# Patient Record
Sex: Male | Born: 1980
Health system: Southern US, Community
[De-identification: ages and names within clinical notes are randomized; demographics above are authoritative.]

## PROBLEM LIST (undated history)

## (undated) DIAGNOSIS — J45909 Unspecified asthma, uncomplicated: Secondary | ICD-10-CM

## (undated) DIAGNOSIS — N481 Balanitis: Secondary | ICD-10-CM

## (undated) DIAGNOSIS — N200 Calculus of kidney: Secondary | ICD-10-CM

## (undated) DIAGNOSIS — Z0000 Encounter for general adult medical examination without abnormal findings: Secondary | ICD-10-CM

## (undated) DIAGNOSIS — Z114 Encounter for screening for human immunodeficiency virus [HIV]: Secondary | ICD-10-CM

## (undated) DIAGNOSIS — J029 Acute pharyngitis, unspecified: Secondary | ICD-10-CM

## (undated) HISTORY — DX: Calculus of kidney: N20.0

## (undated) HISTORY — DX: Unspecified asthma, uncomplicated: J45.909

## (undated) HISTORY — DX: Encounter for general adult medical examination without abnormal findings: Z00.00

## (undated) HISTORY — DX: Balanitis: N48.1

## (undated) HISTORY — DX: Acute pharyngitis, unspecified: J02.9

## (undated) HISTORY — PX: OTHER SURGICAL HISTORY: SHX169

## (undated) HISTORY — DX: Encounter for screening for human immunodeficiency virus (HIV): Z11.4

---

## 2008-11-07 ENCOUNTER — Emergency Department: Payer: Self-pay | Admitting: Emergency Medicine

## 2012-12-26 ENCOUNTER — Emergency Department: Payer: Self-pay | Admitting: Emergency Medicine

## 2015-06-03 ENCOUNTER — Ambulatory Visit (INDEPENDENT_AMBULATORY_CARE_PROVIDER_SITE_OTHER): Payer: PRIVATE HEALTH INSURANCE | Admitting: Family Medicine

## 2015-06-03 ENCOUNTER — Encounter: Payer: Self-pay | Admitting: Family Medicine

## 2015-06-03 VITALS — BP 125/81 | HR 85 | Temp 97.8°F | Ht 66.5 in | Wt 246.0 lb

## 2015-06-03 DIAGNOSIS — E669 Obesity, unspecified: Secondary | ICD-10-CM

## 2015-06-03 DIAGNOSIS — Z23 Encounter for immunization: Secondary | ICD-10-CM

## 2015-06-03 DIAGNOSIS — J029 Acute pharyngitis, unspecified: Secondary | ICD-10-CM | POA: Insufficient documentation

## 2015-06-03 DIAGNOSIS — Z720 Tobacco use: Secondary | ICD-10-CM

## 2015-06-03 HISTORY — DX: Acute pharyngitis, unspecified: J02.9

## 2015-06-03 MED ORDER — TETANUS-DIPHTH-ACELL PERTUSSIS 5-2.5-18.5 LF-MCG/0.5 IM SUSP
0.5000 mL | Freq: Once | INTRAMUSCULAR | Status: AC
  Administered 2015-06-03: 0.5 mL via INTRAMUSCULAR

## 2015-06-03 MED ORDER — AMOXICILLIN-POT CLAVULANATE 875-125 MG PO TABS: 1.0000 | ORAL_TABLET | Freq: Two times a day (BID) | ORAL | Status: DC

## 2015-06-03 NOTE — Patient Instructions (Addendum)
Try vitamin C (orange juice if not diabetic or vitamin C tablets) and drink green tea to help your immune system during your illness Get plenty of rest and hydration Start the antibiotics Please do eat yogurt daily or take a probiotic daily for the next month or two We want to replace the healthy germs in the gut If you notice foul, watery diarrhea in the next two months, schedule an appointment RIGHT AWAY Return for a physical Check out the information at [email or web address] entitled "What It Takes to Lose Weight" Try to lose between 1-2 pounds per week by taking in fewer calories and burning off more calories You can succeed by limiting portions, limiting foods dense in calories and fat, becoming more active, and drinking 8 glasses of water a day (64 ounces) Don't skip meals, especially breakfast, as skipping meals may alter your metabolism Do not use over-the-counter weight loss pills or gimmicks that claim rapid weight loss A healthy BMI (or body mass index) is between 18.5 and 24.9 You can calculate your ideal BMI at the NIH website [employer]huhttp://www.nhlbi.nih.gov/health/educational/lose_wt/BMI/bmicalc.htm You received the vaccine to protect against tetanus and diphtheria and pertussis today; the tetanus and diphtheria portions will provide protection up to ten years, and the pertussis component will give you protection against whooping cough for life    Smokeless Tobacco Use Smokeless tobacco is a loose, fine, or stringy tobacco. The tobacco is not smoked like a cigarette, but it is chewed or held in the lips or cheeks. It resembles tea and comes from the leaves of the tobacco plant. Smokeless tobacco is usually flavored, sweetened, or processed in some way. Although smokeless tobacco is not smoked into the lungs, its chemicals are absorbed through the membranes in the mouth and into the bloodstream. Its chemicals are also swallowed in saliva. The chemicals (nicotine and other toxins) are known to  cause cancer. Smokeless tobacco contains up to 28 differentcarcinogens. CAUSES Nicotine is addictive. Smokeless tobacco contains nicotine, which is a stimulant. This stimulant can give you a "buzz" or altered state. People can become addicted to the feeling it delivers.  SYMPTOMS Smokeless tobacco can cause health problems, including:  Bad breath.  Yellow-brown teeth.  Mouth sores.  Cracking and bleeding lips.  Gum disease, gum recession, and bone loss around the teeth.  Tooth decay.  Increased or irregular heart rate.  High blood pressure, heart disease, and stroke.  Cancer of the mouth, lips, tongue, pancreas, voice box (larynx), esophagus, colon, and bladder.  Precancerous lesion of the soft tissues of the mouth (leukoplakia).  Loss of your sense of taste. TREATMENT Talk with your caregiver about ways you can quit. Quitting tobacco is a good decision for your health. Nicotine is addictive, but several options are available to help you quit including:  Nicotine replacement therapy (gum or patch).  Support and cessation programs. The following tips can help you quit:  Write down the reasons you would like to quit and look at them often.  Set a date during a low stress time to stop or cut back.  Ask family and friends for their support.  Remove all tobacco products from your home and work.  Replace the chewing tobacco with things like beef jerky, sunflower seeds, or shredded coconut.  Avoid situations that may make you want to chew tobacco.  Exercise and eat a healthy diet.  When you crave tobacco, distract yourself with drinking water, sugarless chewing gum, sugarless hard candy, exercising, or deep breathing. HOME CARE INSTRUCTIONS  See  your dentist for regular oral health exams every 6 months.  Follow up with your caregiver as recommended. SEEK MEDICAL CARE OR DENTAL CARE IF:  You have bleeding or cracking lips, gums, or cheeks.  You have mouth  sores, discolorations, or pain.  You have tooth pain.  You develop persistent irritation, burning, or sores in the mouth.  You have pain, tenderness, or numbness in the mouth.  You develop a lump, bumpy patch, or hardened skin inside the mouth.  The color changes inside your mouth (gray, white, or red spots).  You have difficulty chewing, swallowing, or speaking.   This information is not intended to replace advice given to you by your health care provider. Make sure you discuss any questions you have with your health care provider.   Document Released: 10/23/2010 Document Revised: 08/13/2011 Document Reviewed: 10/23/2010 Elsevier Interactive Patient Education [employer]

## 2015-06-03 NOTE — Progress Notes (Signed)
BP 125/81 mmHg  Pulse 85  Temp(Src) 97.8 F (36.6 C)  Ht 5' 6.5" (1.689 m)  Wt 246 lb (111.585 kg)  BMI 39.12 kg/m2  SpO2 96%   Subjective:    Patient ID: Jesus Nelson, male    DOB: 03-07-1981, 34 y.o.   MRN: [id number]  HPI: Jesus Nelson is a 34 y.o. male  Chief Complaint  Patient presents with  . Establish Care  . Sore Throat    woke up this morning feeling "like razor blades"; slight ear ache   He got sick about 4 days; this morning and throat feels like razor blades Trying neti pot Works at [employer] No fever yet No rash No travel Left ear had a heart beat, right ear not as bad; greenish brown snotty stuff Throat left side worse, no swollen lymph nodes No body aches  Dipping tobacco Smoked for 3 years, then switched to dip for his daughter when she was born He says it's habit and he puts a dip in at work and it curbs his appetite  Relevant past medical, surgical, family and social history reviewed and updated as indicated. Interim medical history since our last visit reviewed. Allergies and medications reviewed and updated.  Review of Systems Per HPI unless specifically indicated above     Objective:    BP 125/81 mmHg  Pulse 85  Temp(Src) 97.8 F (36.6 C)  Ht 5' 6.5" (1.689 m)  Wt 246 lb (111.585 kg)  BMI 39.12 kg/m2  SpO2 96%  Wt Readings from Last 3 Encounters:  06/03/15 246 lb (111.585 kg)    Physical Exam  Constitutional: He appears well-developed and well-nourished.  obese  HENT:  Head: Normocephalic and atraumatic.  Right Ear: Tympanic membrane is not erythematous, not retracted and not bulging.  Left Ear: Tympanic membrane is not erythematous, not retracted and not bulging. A middle ear effusion (whitish effusion behind the left TM) is present.  Cardiovascular: Normal rate and regular rhythm.   Pulmonary/Chest: Effort normal and breath sounds normal.  Lymphadenopathy:    He has cervical adenopathy (shoddy left side).  Neurological: He is  alert.  Skin: Skin is warm and dry. No rash noted. No erythema.  Psychiatric: He has a normal mood and affect.   Results for orders placed or performed in visit on 06/03/15  Rapid strep screen (not at [facility])  Result Value Ref Range   Strep Gp A Ag, IA W/Reflex Negative Negative      Assessment & Plan:   Problem List Items Addressed This Visit      Other   Sore throat - Primary    Rapid strep negative, culture pending; it is Friday before long holiday weekend, will start him on amoxicillin/clav; caution about C diff, see AVS; rest, hydration, etc.      Relevant Orders   Rapid strep screen (not at [facility]) (Completed)   Obesity    Will encourage weight loss; see AVS      Smokeless tobacco use    So glad patient was able to give up cigarettes, but hope he'll be able to quit smokeless tobacco       Other Visit Diagnoses    Immunization due        Relevant Medications    Tdap (BOOSTRIX) injection 0.5 mL (Completed)    Need for diphtheria-tetanus-pertussis (Tdap) vaccine, adult/adolescent        vaccine given today; pertussis will be good for life now, next tetanus-diphtheria due in 10 years  Follow up plan: Return in about 2 months (around 08/02/2015), or next few weeks, for complete physical.  An after-visit summary was printed and given to the patient at check-out.  Please see the patient instructions which may contain other information and recommendations beyond what is mentioned above in the assessment and plan. Meds ordered this encounter  Medications  . Tdap (BOOSTRIX) injection 0.5 mL    Sig:   . amoxicillin-clavulanate (AUGMENTIN) 875-125 MG tablet    Sig: Take 1 tablet by mouth 2 (two) times daily.    Dispense:  20 tablet    Refill:  0   Orders Placed This Encounter  Procedures  . Rapid strep screen (not at [facility])

## 2015-06-04 ENCOUNTER — Telehealth: Payer: Self-pay

## 2015-06-04 DIAGNOSIS — Z720 Tobacco use: Secondary | ICD-10-CM | POA: Insufficient documentation

## 2015-06-04 NOTE — Assessment & Plan Note (Signed)
Rapid strep negative, culture pending; it is Friday before long holiday weekend, will start him on amoxicillin/clav; caution about C diff, see AVS; rest, hydration, etc.

## 2015-06-04 NOTE — Telephone Encounter (Signed)
done

## 2015-06-04 NOTE — Assessment & Plan Note (Signed)
Will encourage weight loss; see AVS

## 2015-06-04 NOTE — Assessment & Plan Note (Signed)
So glad patient was able to give up cigarettes, but hope he'll be able to quit smokeless tobacco

## 2015-06-05 LAB — RAPID STREP SCREEN (MED CTR MEBANE ONLY): Strep A Culture: NEGATIVE

## 2015-07-01 ENCOUNTER — Encounter: Payer: Self-pay | Admitting: Family Medicine

## 2015-08-02 ENCOUNTER — Encounter: Payer: Self-pay | Admitting: Family Medicine

## 2015-08-05 ENCOUNTER — Ambulatory Visit (INDEPENDENT_AMBULATORY_CARE_PROVIDER_SITE_OTHER): Payer: PRIVATE HEALTH INSURANCE | Admitting: Family Medicine

## 2015-08-05 ENCOUNTER — Encounter: Payer: Self-pay | Admitting: Family Medicine

## 2015-08-05 VITALS — BP 124/88 | HR 88 | Temp 98.6°F | Ht 66.5 in | Wt 252.0 lb

## 2015-08-05 DIAGNOSIS — Z114 Encounter for screening for human immunodeficiency virus [HIV]: Secondary | ICD-10-CM | POA: Diagnosis not present

## 2015-08-05 DIAGNOSIS — N489 Disorder of penis, unspecified: Secondary | ICD-10-CM | POA: Diagnosis not present

## 2015-08-05 DIAGNOSIS — Z0000 Encounter for general adult medical examination without abnormal findings: Secondary | ICD-10-CM

## 2015-08-05 MED ORDER — BUPROPION HCL 100 MG PO TABS: ORAL_TABLET | ORAL | Status: DC

## 2015-08-05 NOTE — Progress Notes (Signed)
  BP 124/88 mmHg  Pulse 88  Temp(Src) 98.6 F (37 C)  Ht 5' 6.5" (1.689 m)  Wt 252 lb (114.306 kg)  BMI 40.07 kg/m2  SpO2 98%   Subjective:    Patient ID: Jesus Nelson, male    DOB: 02/01/1981, 35 y.o.   MRN: [id number]  HPI: Jesus Nelson is a 35 y.o. male  Chief Complaint  Patient presents with  . Annual Exam   .  Relevant past medical, surgical, family and social history reviewed and updated as indicated. Interim medical history since our last visit reviewed. Allergies and medications reviewed and updated.  Review of Systems  Per HPI unless specifically indicated above     Objective:    BP 124/88 mmHg  Pulse 88  Temp(Src) 98.6 F (37 C)  Ht 5' 6.5" (1.689 m)  Wt 252 lb (114.306 kg)  BMI 40.07 kg/m2  SpO2 98%  Wt Readings from Last 3 Encounters:  08/05/15 252 lb (114.306 kg)  06/03/15 246 lb (111.585 kg)    Physical Exam  Results for orders placed or performed in visit on 06/03/15  [facility])  Result Value Ref Range   Strep A Culture Negative       Assessment & Plan:   Problem List Items Addressed This Visit    None       Follow up plan: No Follow-up on file.

## 2015-08-05 NOTE — Patient Instructions (Addendum)
Check out the information at [email or web address] entitled "What It Takes to Lose Weight" Try to lose between 1-2 pounds per week by taking in fewer calories and burning off more calories You can succeed by limiting portions, limiting foods dense in calories and fat, becoming more active, and drinking 8 glasses of water a day (64 ounces) Don't skip meals, especially breakfast, as skipping meals may alter your metabolism Do not use over-the-counter weight loss pills or gimmicks that claim rapid weight loss A healthy BMI (or body mass index) is between 18.5 and 24.9 You can calculate your ideal BMI at the NIH website [employer]huhttp://www.nhlbi.nih.gov/health/educational/lose_wt/BMI/bmicalc.htm  I've put in a referral for you Please return for labs next week or the week after If you have not heard anything from my staff in a week about any orders/referrals/studies from today, please contact us here to follow-up [phone]  Health Maintenance, Male A healthy lifestyle and preventative care can promote health and wellness.  Maintain regular health, dental, and eye exams.  Eat a healthy diet. Foods like vegetables, fruits, whole grains, low-fat dairy products, and lean protein foods contain the nutrients you need and are low in calories. Decrease your intake of foods high in solid fats, added sugars, and salt. Get information about a proper diet from your health care provider, if necessary.  Regular physical exercise is one of the most important things you can do for your health. Most adults should get at least 150 minutes of moderate-intensity exercise (any activity that increases your heart rate and causes you to sweat) each week. In addition, most adults need muscle-strengthening exercises on 2 or more days a week.   Maintain a healthy weight. The body mass index (BMI) is a screening tool to identify possible weight problems. It provides an estimate of body fat based on height and weight. Your health care  provider can find your BMI and can help you achieve or maintain a healthy weight. For males 20 years and older:  A BMI below 18.5 is considered underweight.  A BMI of 18.5 to 24.9 is normal.  A BMI of 25 to 29.9 is considered overweight.  A BMI of 30 and above is considered obese.  Maintain normal blood lipids and cholesterol by exercising and minimizing your intake of saturated fat. Eat a balanced diet with plenty of fruits and vegetables. Blood tests for lipids and cholesterol should begin at age 36 and be repeated every 5 years. If your lipid or cholesterol levels are high, you are over age 36, or you are at high risk for heart disease, you may need your cholesterol levels checked more frequently.Ongoing high lipid and cholesterol levels should be treated with medicines if diet and exercise are not working.  If you smoke, find out from your health care provider how to quit. If you do not use tobacco, do not start.  Lung cancer screening is recommended for adults aged 55-80 years who are at high risk for developing lung cancer because of a history of smoking. A yearly low-dose CT scan of the lungs is recommended for people who have at least a 30-pack-year history of smoking and are current smokers or have quit within the past 15 years. A pack year of smoking is smoking an average of 1 pack of cigarettes a day for 1 year (for example, a 30-pack-year history of smoking could mean smoking 1 pack a day for 30 years or 2 packs a day for 15 years). Yearly screening should continue until the smoker  has stopped smoking for at least 15 years. Yearly screening should be stopped for people who develop a health problem that would prevent them from having lung cancer treatment.  If you choose to drink alcohol, do not have more than 2 drinks per day. One drink is considered to be 12 oz (360 mL) of beer, 5 oz (150 mL) of wine, or 1.5 oz (45 mL) of liquor.  Avoid the use of street drugs. Do not share needles  with anyone. Ask for help if you need support or instructions about stopping the use of drugs.  High blood pressure causes heart disease and increases the risk of stroke. High blood pressure is more likely to develop in:  People who have blood pressure in the end of the normal range (100-139/85-89 mm Hg).  People who are overweight or obese.  People who are African American.  If you are 81-41 years of age, have your blood pressure checked every 3-5 years. If you are 77 years of age or older, have your blood pressure checked every year. You should have your blood pressure measured twice--once when you are at a hospital or clinic, and once when you are not at a hospital or clinic. Record the average of the two measurements. To check your blood pressure when you are not at a hospital or clinic, you can use:  An automated blood pressure machine at a pharmacy.  A home blood pressure monitor.  If you are 109-73 years old, ask your health care provider if you should take aspirin to prevent heart disease.  Diabetes screening involves taking a blood sample to check your fasting blood sugar level. This should be done once every 3 years after age 47 if you are at a normal weight and without risk factors for diabetes. Testing should be considered at a younger age or be carried out more frequently if you are overweight and have at least 1 risk factor for diabetes.  Colorectal cancer can be detected and often prevented. Most routine colorectal cancer screening begins at the age of 24 and continues through age 44. However, your health care provider may recommend screening at an earlier age if you have risk factors for colon cancer. On a yearly basis, your health care provider may provide home test kits to check for hidden blood in the stool. A small camera at the end of a tube may be used to directly examine the colon (sigmoidoscopy or colonoscopy) to detect the earliest forms of colorectal cancer. Talk to your  health care provider about this at age 72 when routine screening begins. A direct exam of the colon should be repeated every 5-10 years through age 93, unless early forms of precancerous polyps or small growths are found.  People who are at an increased risk for hepatitis B should be screened for this virus. You are considered at high risk for hepatitis B if:  You were born in a country where hepatitis B occurs often. Talk with your health care provider about which countries are considered high risk.  Your parents were born in a high-risk country and you have not received a shot to protect against hepatitis B (hepatitis B vaccine).  You have HIV or AIDS.  You use needles to inject street drugs.  You live with, or have sex with, someone who has hepatitis B.  You are a man who has sex with other men (MSM).  You get hemodialysis treatment.  You take certain medicines for conditions like cancer,  organ transplantation, and autoimmune conditions.  Hepatitis C blood testing is recommended for all people born from [birth year] through 1966 and any individual with known risk factors for hepatitis C.  Healthy men should no longer receive prostate-specific antigen (PSA) blood tests as part of routine cancer screening. Talk to your health care provider about prostate cancer screening.  Testicular cancer screening is not recommended for adolescents or adult males who have no symptoms. Screening includes self-exam, a health care provider exam, and other screening tests. Consult with your health care provider about any symptoms you have or any concerns you have about testicular cancer.  Practice safe sex. Use condoms and avoid high-risk sexual practices to reduce the spread of sexually transmitted infections (STIs).  You should be screened for STIs, including gonorrhea and chlamydia if:  You are sexually active and are younger than 24 years.  You are older than 24 years, and your health care provider tells  you that you are at risk for this type of infection.  Your sexual activity has changed since you were last screened, and you are at an increased risk for chlamydia or gonorrhea. Ask your health care provider if you are at risk.  If you are at risk of being infected with HIV, it is recommended that you take a prescription medicine daily to prevent HIV infection. This is called pre-exposure prophylaxis (PrEP). You are considered at risk if:  You are a man who has sex with other men (MSM).  You are a heterosexual man who is sexually active with multiple partners.  You take drugs by injection.  You are sexually active with a partner who has HIV.  Talk with your health care provider about whether you are at high risk of being infected with HIV. If you choose to begin PrEP, you should first be tested for HIV. You should then be tested every 3 months for as long as you are taking PrEP.  Use sunscreen. Apply sunscreen liberally and repeatedly throughout the day. You should seek shade when your shadow is shorter than you. Protect yourself by wearing long sleeves, pants, a wide-brimmed hat, and sunglasses year round whenever you are outdoors.  Tell your health care provider of new moles or changes in moles, especially if there is a change in shape or color. Also, tell your health care provider if a mole is larger than the size of a pencil eraser.  A one-time screening for abdominal aortic aneurysm (AAA) and surgical repair of large AAAs by ultrasound is recommended for men aged 65-75 years who are current or former smokers.  Stay current with your vaccines (immunizations).   This information is not intended to replace advice given to you by your health care provider. Make sure you discuss any questions you have with your health care provider.   Document Released: 11/17/2007 Document Revised: 06/11/2014 Document Reviewed: 10/16/2010 Elsevier Interactive Patient Education [employer]

## 2015-08-05 NOTE — Progress Notes (Signed)
Patient ID: Jesus Nelson, male   DOB: May 19, 1981, 35 y.o.   MRN: [id number]   Subjective:   Jesus Nelson is a 35 y.o. male here for a complete physical exam  Interim issues since last visit: no medical excitement  USPSTF grade A and B recommendations Alcohol: less than 14 per week Depression:  Depression screen [facility] 2/9 08/05/2015  Decreased Interest 0  Down, Depressed, Hopeless 0  PHQ - 2 Score 0   Hypertension: controlled Obesity: BMI above 40; gains weight in winter weight comes down in summer Tobacco use: smokeless tobacco; tried to quit; used patches; smoked for years, then quit cigs when daughter was born HIV, hep B, hep C: hiv testing, not the others STD testing and prevention (chl/gon/syphilis): see below Lipids: return fasting Glucose: fasting Colorectal cancer: no 1st degree relatives Breast cancer: no lumps, mother had breast cancer Lung cancer: n/a Osteoporosis: n/a AAA: n/a Aspirin: na/ Diet: typical diet Exercise: no regular exercise; up and about all day at work, carrying cylinders, exercising Skin cancer: gets frequent skin tags; had one on penis, clipped it with toenail clippers, then 3 days later there was a sore; told them across the street; they thought it was herpes and put him on acyclovir topical; head of penis turned opaque color; then they sent him to urology; they didn't do any of their tests, just went by urgent care; they gave him another prescription of acyclovir PO; he thinks he has a fungal infection; they sent his bloodwork 3 times and 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 Asthma     seasonal   History reviewed. No pertinent past surgical history. Family History   Problem Relation Age of Onset  . Cancer Mother     breast  . Diabetes Maternal Grandmother   . COPD Neg Hx   . Heart disease Neg Hx   . Hypertension Neg Hx   . Stroke Neg Hx   Mother -- high blood pressure Mother -- gastric bypass surgery Grandmother -- high blood pressure  Social History  Substance Use Topics  . Smoking status: Former Smoker -- 3 years    Types: Cigarettes    Quit date: 06/03/1999  . Smokeless tobacco: Current User    Types: Chew  . Alcohol Use: Yes     Comment: occasional   Review of Systems  Objective:   Filed Vitals:   08/05/15 1401  BP: 124/88  Pulse: 88  Temp: 98.6 F (37 C)  Height: 5' 6.5" (1.689 m)  Weight: 252 lb (114.306 kg)  SpO2: 98%   Body mass index is 40.07 kg/(m^2). Wt Readings from Last 3 Encounters:  08/05/15 252 lb (114.306 kg)  06/03/15 246 lb (111.585 kg)   Physical Exam  Constitutional: He appears well-developed and well-nourished. No distress.  HENT:  Head: Normocephalic and atraumatic.  Nose: Nose normal.  Mouth/Throat: Oropharynx is clear and moist.  Eyes: EOM are normal. No scleral icterus.  Neck: No JVD present. No thyromegaly present.  Cardiovascular: Normal rate, regular rhythm and normal heart sounds.   Pulmonary/Chest: Effort normal and breath sounds normal. No respiratory distress. He has no wheezes. He has no  rales.  Abdominal: Soft. Bowel sounds are normal. He exhibits no distension. There is no tenderness. There is no guarding.  Genitourinary: No penile erythema. No discharge found.  There is irregular hypopigmentation of the skin at the head of the penis and distal shaft; solitary erythematous papule left side about 2x2x1 mm; no vesicles  Musculoskeletal: Normal range of motion. He exhibits no edema.  Lymphadenopathy:    He has no cervical adenopathy.  Neurological: He is alert. He displays normal reflexes. He exhibits normal muscle tone. Coordination normal.  Skin: Skin is warm and dry. No rash noted.  He is not diaphoretic. No erythema. No pallor.  Psychiatric: He has a normal mood and affect. His behavior is normal. Judgment and thought content normal.    Assessment/Plan:   Problem List Items Addressed This Visit      Genitourinary   Penile abnormality   Relevant Orders   Ambulatory referral to Urology     Other   Screening for HIV (human immunodeficiency virus)    Discussed one-time HIV screening recommendation per USPSTF guidelines; patient agrees with testing; HIV antibody ordered      Relevant Orders   HIV antibody (Completed)   Preventative health care - Primary    USPSTF grade A and B recommendations reviewed with patient; age-appropriate recommendations, preventive care, screening tests, etc discussed and encouraged; healthy living encouraged; see AVS for patient education given to patient      Relevant Orders   Lipid Panel w/o Chol/HDL Ratio (Completed)   Comprehensive metabolic panel (Completed)   CBC with Differential/Platelet (Completed)   TSH (Completed)       Follow up plan: Return in about 1 year (around 08/04/2016) for complete physical.  An after-visit summary was printed and given to the patient at check-out.  Please see the patient instructions which may contain other information and recommendations beyond what is mentioned above in the assessment and plan.  Orders Placed This Encounter  Procedures  . HIV antibody  . Lipid Panel w/o Chol/HDL Ratio  . Comprehensive metabolic panel  . CBC with Differential/Platelet  . TSH  . Ambulatory referral to Urology

## 2015-08-08 ENCOUNTER — Other Ambulatory Visit: Payer: PRIVATE HEALTH INSURANCE

## 2015-08-08 DIAGNOSIS — Z0000 Encounter for general adult medical examination without abnormal findings: Secondary | ICD-10-CM

## 2015-08-08 DIAGNOSIS — Z114 Encounter for screening for human immunodeficiency virus [HIV]: Secondary | ICD-10-CM

## 2015-08-08 HISTORY — DX: Encounter for general adult medical examination without abnormal findings: Z00.00

## 2015-08-08 HISTORY — DX: Encounter for screening for human immunodeficiency virus (HIV): Z11.4

## 2015-08-09 LAB — CBC WITH DIFFERENTIAL/PLATELET
Basophils Absolute: 0 10*3/uL (ref 0.0–0.2)
Basos: 0 %
EOS (ABSOLUTE): 0.2 10*3/uL (ref 0.0–0.4)
EOS: 3 %
HEMATOCRIT: 45.7 % (ref 37.5–51.0)
HEMOGLOBIN: 15.2 g/dL (ref 12.6–17.7)
IMMATURE GRANS (ABS): 0 10*3/uL (ref 0.0–0.1)
Immature Granulocytes: 0 %
LYMPHS ABS: 1.6 10*3/uL (ref 0.7–3.1)
Lymphs: 28 %
MCH: 30 pg (ref 26.6–33.0)
MCHC: 33.3 g/dL (ref 31.5–35.7)
MCV: 90 fL (ref 79–97)
MONOCYTES: 11 %
Monocytes Absolute: 0.6 10*3/uL (ref 0.1–0.9)
NEUTROS ABS: 3.2 10*3/uL (ref 1.4–7.0)
Neutrophils: 58 %
Platelets: 250 10*3/uL (ref 150–379)
RBC: 5.07 x10E6/uL (ref 4.14–5.80)
RDW: 13 % (ref 12.3–15.4)
WBC: 5.6 10*3/uL (ref 3.4–10.8)

## 2015-08-09 LAB — LIPID PANEL W/O CHOL/HDL RATIO
Cholesterol, Total: 179 mg/dL (ref 100–199)
HDL: 44 mg/dL (ref >39)
LDL Calculated: 106 mg/dL — ABNORMAL HIGH (ref 0–99)
TRIGLYCERIDES: 144 mg/dL (ref 0–149)
VLDL Cholesterol Cal: 29 mg/dL (ref 5–40)

## 2015-08-09 LAB — COMPREHENSIVE METABOLIC PANEL
ALBUMIN: 4.3 g/dL (ref 3.5–5.5)
ALT: 34 IU/L (ref 0–44)
AST: 20 IU/L (ref 0–40)
Albumin/Globulin Ratio: 1.7 (ref 1.1–2.5)
Alkaline Phosphatase: 47 IU/L (ref 39–117)
BUN / CREAT RATIO: 15 (ref 8–19)
BUN: 16 mg/dL (ref 6–20)
Bilirubin Total: 0.4 mg/dL (ref 0.0–1.2)
CALCIUM: 9.2 mg/dL (ref 8.7–10.2)
CO2: 24 mmol/L (ref 18–29)
CREATININE: 1.04 mg/dL (ref 0.76–1.27)
Chloride: 100 mmol/L (ref 96–106)
GFR, EST AFRICAN AMERICAN: 106 mL/min/{1.73_m2} (ref >59)
GFR, EST NON AFRICAN AMERICAN: 92 mL/min/{1.73_m2} (ref >59)
GLUCOSE: 85 mg/dL (ref 65–99)
Globulin, Total: 2.5 g/dL (ref 1.5–4.5)
Potassium: 4.7 mmol/L (ref 3.5–5.2)
Sodium: 139 mmol/L (ref 134–144)
TOTAL PROTEIN: 6.8 g/dL (ref 6.0–8.5)

## 2015-08-09 LAB — HIV ANTIBODY (ROUTINE TESTING W REFLEX): HIV SCREEN 4TH GENERATION: NONREACTIVE

## 2015-08-09 LAB — TSH: TSH: 2.13 u[IU]/mL (ref 0.450–4.500)

## 2015-08-10 NOTE — Assessment & Plan Note (Signed)
USPSTF grade A and B recommendations reviewed with patient; age-appropriate recommendations, preventive care, screening tests, etc discussed and encouraged; healthy living encouraged; see AVS for patient education given to patient  

## 2015-08-10 NOTE — Assessment & Plan Note (Signed)
Discussed one-time HIV screening recommendation per USPSTF guidelines; patient agrees with testing; HIV antibody ordered 

## 2015-08-19 ENCOUNTER — Ambulatory Visit (INDEPENDENT_AMBULATORY_CARE_PROVIDER_SITE_OTHER): Payer: PRIVATE HEALTH INSURANCE | Admitting: Urology

## 2015-08-19 ENCOUNTER — Encounter: Payer: Self-pay | Admitting: Urology

## 2015-08-19 VITALS — BP 123/85 | HR 79 | Ht 69.0 in | Wt 251.4 lb

## 2015-08-19 DIAGNOSIS — N480 Leukoplakia of penis: Secondary | ICD-10-CM

## 2015-08-19 MED ORDER — CLOBETASOL PROPIONATE 0.05 % EX CREA: 1.0000 "application " | TOPICAL_CREAM | Freq: Two times a day (BID) | CUTANEOUS | Status: DC

## 2015-08-19 NOTE — Progress Notes (Signed)
08/19/2015 10:51 AM   Jesus Nelson 1980-12-06 [id number]  Referring provider: Kerman Passey, MD [address] [place], [place] [address]  Chief Complaint  Patient presents with  . New Patient (Initial Visit)  . Other    "discoloration of penis head"    HPI: The patient is a 35 year old male presents for evaluation of discoloration to the penis. It all starts he believes 2 years ago when he clipped the skin tag on his left mid shaft of his penis. He then noted white discoloration of the tip of his penis. This gone on since that time. He is got multiple diagnoses including herpes however he is only had positive laboratory tests for HSV 1 and he has been in the stable relationship for 17 years.  He is very disturbed by this discoloration is concerned as to why is there. He notes that it is white discoloration often causes severe itching especially after work. He does note some post void dribbling after urination. He denies any spraying of his urine.   PMH: Past Medical History  Diagnosis Date  . Asthma     seasonal  . Kidney stones     Surgical History: No past surgical history on file.  Home Medications:    Medication List       This list is accurate as of: 08/19/15 10:51 AM.  Always use your most recent med list.               albuterol 108 (90 Base) MCG/ACT inhaler  Commonly known as:  PROVENTIL HFA;VENTOLIN HFA  Inhale 1 puff into the lungs every 6 (six) hours as needed for wheezing or shortness of breath.     buPROPion 100 MG tablet  Commonly known as:  WELLBUTRIN  One by mouth every morning x 3 days, then one twice a day (2nd pill no later than 4 pm)     clobetasol cream 0.05 %  Commonly known as:  TEMOVATE  Apply 1 application topically 2 (two) times daily.     fluticasone 50 MCG/ACT nasal spray  Commonly known as:  FLONASE  Place 2 sprays into both nostrils daily.        Allergies: No Known Allergies  Family History: Family History  Problem Relation Age  of Onset  . Cancer Mother     breast  . Diabetes Maternal Grandmother   . COPD Neg Hx   . Heart disease Neg Hx   . Hypertension Neg Hx   . Stroke Neg Hx   . Bladder Cancer Neg Hx   . Kidney disease Neg Hx   . Prostate cancer Neg Hx   . Sickle cell anemia Neg Hx     Social History:  reports that he quit smoking about 16 years ago. His smoking use included Cigarettes. He quit after 3 years of use. His smokeless tobacco use includes Chew. He reports that he drinks alcohol. He reports that he does not use illicit drugs.  ROS: UROLOGY Frequent Urination?: Yes Hard to postpone urination?: No Burning/pain with urination?: No Get up at night to urinate?: Yes Leakage of urine?: Yes Urine stream starts and stops?: No Trouble starting stream?: No Do you have to strain to urinate?: No Blood in urine?: No Urinary tract infection?: No Sexually transmitted disease?: No Injury to kidneys or bladder?: No Painful intercourse?: No Weak stream?: No Erection problems?: No Penile pain?: No  Gastrointestinal Nausea?: No Vomiting?: No Indigestion/heartburn?: No Diarrhea?: No Constipation?: No  Constitutional Fever:  No Night sweats?: No Weight loss?: No Fatigue?: No  Skin Skin rash/lesions?: No Itching?: No  Eyes Blurred vision?: No Double vision?: No  Ears/Nose/Throat Sore throat?: No Sinus problems?: No  Hematologic/Lymphatic Swollen glands?: No Easy bruising?: No  Cardiovascular Leg swelling?: No Chest pain?: No  Respiratory Cough?: No Shortness of breath?: No  Endocrine Excessive thirst?: No  Musculoskeletal Back pain?: No Joint pain?: No  Neurological Headaches?: No Dizziness?: No  Psychologic Depression?: No Anxiety?: No  Physical Exam: BP 123/85 mmHg  Pulse 79  Ht 5\' 9"  (1.753 m)  Wt 251 lb 6.4 oz (114.034 kg)  BMI 37.11 kg/m2  Constitutional:  Alert and oriented, No acute distress. HEENT: [place] AT, moist mucus membranes.  Trachea midline, no  masses. Cardiovascular: No clubbing, cyanosis, or edema. Respiratory: Normal respiratory effort, no increased work of breathing. GI: Abdomen is soft, nontender, nondistended, no abdominal masses GU: No CVA tenderness. Testicles descended equal bilaterally nontender to palpation. No masses. The glans penis is white in color and somewhat dry consistent with lichen sclerosis. He does have some mild narrowing of his meatus. Overall his exam is consistent with lichen sclerosis/BXO. Skin: No rashes, bruises or suspicious lesions. Lymph: No cervical or inguinal adenopathy. Neurologic: Grossly intact, no focal deficits, moving all 4 extremities. Psychiatric: Normal mood and affect.  Laboratory Data: Lab Results  Component Value Date   WBC 5.6 08/08/2015   HCT 45.7 08/08/2015   MCV 90 08/08/2015   PLT 250 08/08/2015    Lab Results  Component Value Date   CREATININE 1.04 08/08/2015    No results found for: PSA  No results found for: TESTOSTERONE  No results found for: HGBA1C  Urinalysis No results found for: COLORURINE, APPEARANCEUR, LABSPEC, PHURINE, GLUCOSEU, HGBUR, BILIRUBINUR, KETONESUR, PROTEINUR, UROBILINOGEN, NITRITE, LEUKOCYTESUR    Assessment & Plan:    1. Lichen sclerosis/BXO I discussed with the patient the natural history of lichen sclerosis/BXO. Typically treatment is reserved for those that are symptomatic. I feel that this time he is symptomatic due to his pruritus as well as early meatal stenosis. I have started him on a course of clobetasol 0.05% cream twice a day. He was instructed to try use this medication for a month in follow-up in one month to discuss his progress. He does know that there is no cure for this disease and treatment is usually aimed at symptom control. He does understand is at risk for urethral strictures and meatal stenosis with a result of this disease process. He'll follow-up in one month to update is on his progress with clobetasol.    Return in  about 4 weeks (around 09/16/2015).  Hildred LaserBrian James Cottrell Gentles, MD  [facility] [address], [address] [place], [place] [address] [phone]

## 2015-09-09 ENCOUNTER — Other Ambulatory Visit: Payer: Self-pay | Admitting: Family Medicine

## 2015-09-09 MED ORDER — BUPROPION HCL ER (SR) 150 MG PO TB12: 150.0000 mg | ORAL_TABLET | Freq: Two times a day (BID) | ORAL | Status: DC

## 2015-09-15 ENCOUNTER — Encounter: Payer: Self-pay | Admitting: Urology

## 2015-09-15 ENCOUNTER — Ambulatory Visit (INDEPENDENT_AMBULATORY_CARE_PROVIDER_SITE_OTHER): Payer: PRIVATE HEALTH INSURANCE | Admitting: Urology

## 2015-09-15 VITALS — BP 128/89 | HR 82 | Ht 68.0 in | Wt 254.0 lb

## 2015-09-15 DIAGNOSIS — N480 Leukoplakia of penis: Secondary | ICD-10-CM

## 2015-09-15 NOTE — Progress Notes (Signed)
09/15/2015 11:10 AM   Jesus Nelson [birth year] [id number]  Referring provider: Kerman Passey, MD [address] [place], [place] [address]  Chief Complaint  Patient presents with  . balanitis    4 week follow up    HPI: The patient is a 35 year old male presents for evaluation of discoloration to the penis. It all starts he believes 2 years ago when he clipped the skin tag on his left mid shaft of his penis. He then noted white discoloration of the tip of his penis. This gone on since that time. He is got multiple diagnoses including herpes however he is only had positive laboratory tests for HSV 1 and he has been in the stable relationship for 17 years. He is very disturbed by this discoloration is concerned as to why is there. He notes that it is white discoloration often causes severe itching especially after work. He does note some post void dribbling after urination. He denies any spraying of his urine.   At his last, he was started on clobetasol cream twice daily. He said his symptoms including pruritus and dysuria for: Results: Week. He still taking the cream. He feels he is back to his baseline and has no complaints.  PMH: Past Medical History  Diagnosis Date  . Asthma     seasonal  . Kidney stones   . Balanitis     Surgical History: Past Surgical History  Procedure Laterality Date  . Thumb cyst      Home Medications:    Medication List       This list is accurate as of: 09/15/15 11:10 AM.  Always use your most recent med list.               albuterol 108 (90 Base) MCG/ACT inhaler  Commonly known as:  PROVENTIL HFA;VENTOLIN HFA  Inhale 1 puff into the lungs every 6 (six) hours as needed for wheezing or shortness of breath.     buPROPion 100 MG tablet  Commonly known as:  WELLBUTRIN  One by mouth every morning x 3 days, then one twice a day (2nd pill no later than 4 pm)     buPROPion 150 MG 12 hr tablet  Commonly known as:  WELLBUTRIN SR  Take  1 tablet (150 mg total) by mouth 2 (two) times daily. 2nd pill around 5 pm     clobetasol cream 0.05 %  Commonly known as:  TEMOVATE  Apply 1 application topically 2 (two) times daily.     fluticasone 50 MCG/ACT nasal spray  Commonly known as:  FLONASE  Place 2 sprays into both nostrils daily.        Allergies: No Known Allergies  Family History: Family History  Problem Relation Age of Onset  . Cancer Mother     breast  . Diabetes Maternal Grandmother   . COPD Neg Hx   . Heart disease Neg Hx   . Hypertension Neg Hx   . Stroke Neg Hx   . Bladder Cancer Neg Hx   . Kidney disease Neg Hx   . Prostate cancer Neg Hx   . Sickle cell anemia Neg Hx     Social History:  reports that he quit smoking about 16 years ago. His smoking use included Cigarettes. He quit after 3 years of use. His smokeless tobacco use includes Chew. He reports that he drinks alcohol. He reports that he does not use illicit drugs.  ROS: UROLOGY Frequent Urination?: No Hard  to postpone urination?: No Burning/pain with urination?: No Get up at night to urinate?: No Leakage of urine?: No Urine stream starts and stops?: No Trouble starting stream?: No Do you have to strain to urinate?: No Blood in urine?: No Urinary tract infection?: No Sexually transmitted disease?: No Injury to kidneys or bladder?: No Painful intercourse?: No Weak stream?: No Erection problems?: No Penile pain?: No  Gastrointestinal Nausea?: No Vomiting?: No Indigestion/heartburn?: No Diarrhea?: No Constipation?: No  Constitutional Fever: No Night sweats?: No Weight loss?: No Fatigue?: No  Skin Skin rash/lesions?: No Itching?: No  Eyes Blurred vision?: No Double vision?: No  Ears/Nose/Throat Sore throat?: No Sinus problems?: No  Hematologic/Lymphatic Swollen glands?: No Easy bruising?: No  Cardiovascular Leg swelling?: No Chest pain?: No  Respiratory Cough?: No Shortness of breath?:  No  Endocrine Excessive thirst?: No  Musculoskeletal Back pain?: No Joint pain?: No  Neurological Headaches?: No Dizziness?: No  Psychologic Depression?: No Anxiety?: No  Physical Exam: BP 128/89 mmHg  Pulse 82  Ht 5\' 8"  (1.727 m)  Wt 254 lb (115.214 kg)  BMI 38.63 kg/m2  Constitutional:  Alert and oriented, No acute distress. HEENT: [place] AT, moist mucus membranes.  Trachea midline, no masses. Cardiovascular: No clubbing, cyanosis, or edema. Respiratory: Normal respiratory effort, no increased work of breathing. GI: Abdomen is soft, nontender, nondistended, no abdominal masses GU: No CVA tenderness. Phallus uncircumcised. He does have a small amount of meatal stenosis with pigment changes of his glans penis consistent with BXO. No other abnormalities noted. Skin: No rashes, bruises or suspicious lesions. Lymph: No cervical or inguinal adenopathy. Neurologic: Grossly intact, no focal deficits, moving all 4 extremities. Psychiatric: Normal mood and affect.  Laboratory Data: Lab Results  Component Value Date   WBC 5.6 08/08/2015   HCT 45.7 08/08/2015   MCV 90 08/08/2015   PLT 250 08/08/2015    Lab Results  Component Value Date   CREATININE 1.04 08/08/2015    No results found for: PSA  No results found for: TESTOSTERONE  No results found for: HGBA1C  Urinalysis No results found for: COLORURINE, APPEARANCEUR, LABSPEC, PHURINE, GLUCOSEU, HGBUR, BILIRUBINUR, KETONESUR, PROTEINUR, UROBILINOGEN, NITRITE, LEUKOCYTESUR   Assessment & Plan:    1. Lichen sclerosis/BXO The patient is no longer symptomatic from his BX O. Advised him to stop medication at this time because his medication is reserved for symptomatic episodes of BX O. He was told that this could be a chronic problem for him and he may have recurrence. At that point he can restart his steroid. He also was warned of the risk of urethral strictures. There is also increased risks of penile malignancy though it is  small. We'll see him back on a yearly basis to monitor for evidence of penile malignancy.  Return in about 1 year (around 09/14/2016).  Hildred LaserBrian James Sten Dematteo, MD  [facility] [address], [address] [place], [place] [address] [phone]

## 2016-08-23 ENCOUNTER — Telehealth: Payer: Self-pay

## 2016-08-23 NOTE — Telephone Encounter (Signed)
The pt is requesting a refill on his clobetasol cream 0.05% Please advise

## 2016-09-14 ENCOUNTER — Ambulatory Visit: Payer: PRIVATE HEALTH INSURANCE

## 2016-09-19 ENCOUNTER — Ambulatory Visit: Payer: PRIVATE HEALTH INSURANCE

## 2016-09-21 ENCOUNTER — Encounter: Payer: Self-pay | Admitting: Urology

## 2016-09-21 ENCOUNTER — Ambulatory Visit (INDEPENDENT_AMBULATORY_CARE_PROVIDER_SITE_OTHER): Payer: BLUE CROSS/BLUE SHIELD | Admitting: Urology

## 2016-09-21 VITALS — BP 130/93 | HR 92 | Ht 68.0 in | Wt 254.1 lb

## 2016-09-21 DIAGNOSIS — N480 Leukoplakia of penis: Secondary | ICD-10-CM

## 2016-09-21 MED ORDER — CLOBETASOL PROPIONATE 0.05 % EX CREA: 1.0000 "application " | TOPICAL_CREAM | Freq: Two times a day (BID) | CUTANEOUS | 0 refills | Status: DC

## 2016-09-21 NOTE — Progress Notes (Signed)
09/21/2016 4:25 PM   [name] 1980-12-27 [id number]  Referring provider: Kerman Passey, MD [address] [place], [place] [address]  Chief Complaint  Patient presents with  . Follow-up    BXO, lichen sclerosis    HPI: The patient is a 36 year old male who has been diagnosed in the past with BXO presents today for his annual follow-up to monitor for malignancy and urethral stricture symptoms. Over the last year he has done well with his symptom control. He has had these clobetasol cream twice over the last year. Both times were for a 7 day period. Other than that he has been asymptomatic. He voids with a good stream. He is currently not extrinsic itching or pain.   PMH: Past Medical History:  Diagnosis Date  . Asthma    seasonal  . Balanitis   . Kidney stones     Surgical History: Past Surgical History:  Procedure Laterality Date  . thumb cyst      Home Medications:  Allergies as of 09/21/2016   No Known Allergies     Medication List       Accurate as of 09/21/16  4:25 PM. Always use your most recent med list.          albuterol 108 (90 Base) MCG/ACT inhaler Commonly known as:  PROVENTIL HFA;VENTOLIN HFA Inhale 1 puff into the lungs every 6 (six) hours as needed for wheezing or shortness of breath.   buPROPion 100 MG tablet Commonly known as:  WELLBUTRIN One by mouth every morning x 3 days, then one twice a day (2nd pill no later than 4 pm)   buPROPion 150 MG 12 hr tablet Commonly known as:  WELLBUTRIN SR Take 1 tablet (150 mg total) by mouth 2 (two) times daily. 2nd pill around 5 pm   clobetasol cream 0.05 % Commonly known as:  TEMOVATE Apply 1 application topically 2 (two) times daily.   fluticasone 50 MCG/ACT nasal spray Commonly known as:  FLONASE Place 2 sprays into both nostrils daily.       Allergies: No Known Allergies  Family History: Family History  Problem Relation Age of Onset  . Cancer Mother     breast  .  Diabetes Maternal Grandmother   . COPD Neg Hx   . Heart disease Neg Hx   . Hypertension Neg Hx   . Stroke Neg Hx   . Bladder Cancer Neg Hx   . Kidney disease Neg Hx   . Prostate cancer Neg Hx   . Sickle cell anemia Neg Hx     Social History:  reports that he quit smoking about 17 years ago. His smoking use included Cigarettes. He quit after 3.00 years of use. His smokeless tobacco use includes Chew. He reports that he drinks alcohol. He reports that he does not use drugs.  ROS: UROLOGY Frequent Urination?: No Hard to postpone urination?: No Burning/pain with urination?: No Get up at night to urinate?: No Leakage of urine?: No Urine stream starts and stops?: No Trouble starting stream?: No Do you have to strain to urinate?: No Blood in urine?: No Urinary tract infection?: No Sexually transmitted disease?: No Injury to kidneys or bladder?: No Painful intercourse?: No Weak stream?: No Erection problems?: No Penile pain?: No  Gastrointestinal Nausea?: No Vomiting?: No Indigestion/heartburn?: No Diarrhea?: No Constipation?: No  Constitutional Fever: No Night sweats?: No Weight loss?: No Fatigue?: No  Skin Skin rash/lesions?: No Itching?: No  Eyes Blurred vision?: No Double  vision?: No  Ears/Nose/Throat Sore throat?: No Sinus problems?: No  Hematologic/Lymphatic Swollen glands?: No Easy bruising?: No  Cardiovascular Leg swelling?: No Chest pain?: No  Respiratory Cough?: No Shortness of breath?: No  Endocrine Excessive thirst?: No  Musculoskeletal Back pain?: No Joint pain?: No  Neurological Headaches?: No Dizziness?: No  Psychologic Depression?: No Anxiety?: No  Physical Exam: BP (!) 130/93   Pulse 92   Ht  (1.727 m)   Wt 254 lb 1.6 oz (115.3 kg)   BMI 38.64 kg/m   Constitutional:  Alert and oriented, No acute distress. HEENT: [place] AT, moist mucus membranes.  Trachea midline, no masses. Cardiovascular: No clubbing, cyanosis, or  edema. Respiratory: Normal respiratory effort, no increased work of breathing. GI: Abdomen is soft, nontender, nondistended, no abdominal masses GU: No CVA tenderness. Discoloration of the glans penis consistent with BX O. He has very mild meatal stenosis. Testicles descended equally bilaterally no masses. Skin: No rashes, bruises or suspicious lesions. Lymph: No cervical or inguinal adenopathy. Neurologic: Grossly intact, no focal deficits, moving all 4 extremities. Psychiatric: Normal mood and affect.  Laboratory Data: Lab Results  Component Value Date   WBC 5.6 08/08/2015   HCT 45.7 08/08/2015   MCV 90 08/08/2015   PLT 250 08/08/2015    Lab Results  Component Value Date   CREATININE 1.04 08/08/2015    No results found for: PSA  No results found for: TESTOSTERONE  No results found for: HGBA1C  Urinalysis No results found for: COLORURINE, APPEARANCEUR, LABSPEC, PHURINE, GLUCOSEU, HGBUR, BILIRUBINUR, KETONESUR, PROTEINUR, UROBILINOGEN, NITRITE, LEUKOCYTESUR   Assessment & Plan:    1. Lichen sclerosis/BXO -Patient doing well and asymptomatic at this point. He was again reminded of the risk for malignancy though small as well as urethral strictures. We will refill his clobetasol x1 at this time. He will use this only during symptomatic episodes as he has been doing in the past. He'll follow-up on annual basis for monitoring.   Return in about 1 year (around 09/21/2017).  Hildred Laser, MD  [facility] [address], [address] [place], [place] [address] [phone]

## 2017-12-03 DIAGNOSIS — R208 Other disturbances of skin sensation: Secondary | ICD-10-CM | POA: Diagnosis not present

## 2017-12-03 DIAGNOSIS — L400 Psoriasis vulgaris: Secondary | ICD-10-CM | POA: Diagnosis not present

## 2017-12-03 DIAGNOSIS — R58 Hemorrhage, not elsewhere classified: Secondary | ICD-10-CM | POA: Diagnosis not present

## 2017-12-03 DIAGNOSIS — L980 Pyogenic granuloma: Secondary | ICD-10-CM | POA: Diagnosis not present

## 2017-12-03 DIAGNOSIS — D485 Neoplasm of uncertain behavior of skin: Secondary | ICD-10-CM | POA: Diagnosis not present

## 2018-02-24 DIAGNOSIS — B070 Plantar wart: Secondary | ICD-10-CM | POA: Diagnosis not present

## 2018-05-16 ENCOUNTER — Ambulatory Visit (INDEPENDENT_AMBULATORY_CARE_PROVIDER_SITE_OTHER): Payer: BLUE CROSS/BLUE SHIELD | Admitting: Family Medicine

## 2018-05-16 ENCOUNTER — Encounter: Payer: Self-pay | Admitting: Family Medicine

## 2018-05-16 VITALS — BP 116/78 | HR 99 | Temp 98.9°F | Ht 68.0 in | Wt 263.8 lb

## 2018-05-16 DIAGNOSIS — K644 Residual hemorrhoidal skin tags: Secondary | ICD-10-CM

## 2018-05-16 MED ORDER — HYDROCORTISONE 2.5 % RE CREA: 1.0000 "application " | TOPICAL_CREAM | Freq: Two times a day (BID) | RECTAL | 0 refills | Status: DC

## 2018-05-16 NOTE — Patient Instructions (Addendum)
We'll have you see Dr. Allegra LaiVanga as soon as possible  Hemorrhoids Hemorrhoids are swollen veins in and around the rectum or anus. There are two types of hemorrhoids:  Internal hemorrhoids. These occur in the veins that are just inside the rectum. They may poke through to the outside and become irritated and painful.  External hemorrhoids. These occur in the veins that are outside of the anus and can be felt as a painful swelling or hard lump near the anus.  Most hemorrhoids do not cause serious problems, and they can be managed with home treatments such as diet and lifestyle changes. If home treatments do not help your symptoms, procedures can be done to shrink or remove the hemorrhoids. What are the causes? This condition is caused by increased pressure in the anal area. This pressure may result from various things, including:  Constipation.  Straining to have a bowel movement.  Diarrhea.  Pregnancy.  Obesity.  Sitting for long periods of time.  Heavy lifting or other activity that causes you to strain.  Anal sex.  What are the signs or symptoms? Symptoms of this condition include:  Pain.  Anal itching or irritation.  Rectal bleeding.  Leakage of stool (feces).  Anal swelling.  One or more lumps around the anus.  How is this diagnosed? This condition can often be diagnosed through a visual exam. Other exams or tests may also be done, such as:  Examination of the rectal area with a gloved hand (digital rectal exam).  Examination of the anal canal using a small tube (anoscope).  A blood test, if you have lost a significant amount of blood.  A test to look inside the colon (sigmoidoscopy or colonoscopy).  How is this treated? This condition can usually be treated at home. However, various procedures may be done if dietary changes, lifestyle changes, and other home treatments do not help your symptoms. These procedures can help make the hemorrhoids smaller or remove  them completely. Some of these procedures involve surgery, and others do not. Common procedures include:  Rubber band ligation. Rubber bands are placed at the base of the hemorrhoids to cut off the blood supply to them.  Sclerotherapy. Medicine is injected into the hemorrhoids to shrink them.  Infrared coagulation. A type of light energy is used to get rid of the hemorrhoids.  Hemorrhoidectomy surgery. The hemorrhoids are surgically removed, and the veins that supply them are tied off.  Stapled hemorrhoidopexy surgery. A circular stapling device is used to remove the hemorrhoids and use staples to cut off the blood supply to them.  Follow these instructions at home: Eating and drinking  Eat foods that have a lot of fiber in them, such as whole grains, beans, nuts, fruits, and vegetables. Ask your health care provider about taking products that have added fiber (fiber supplements).  Drink enough fluid to keep your urine clear or pale yellow. Managing pain and swelling  Take warm sitz baths for 20 minutes, 3-4 times a day to ease pain and discomfort.  If directed, apply ice to the affected area. Using ice packs between sitz baths may be helpful. ? Put ice in a plastic bag. ? Place a towel between your skin and the bag. ? Leave the ice on for 20 minutes, 2-3 times a day. General instructions  Take over-the-counter and prescription medicines only as told by your health care provider.  Use medicated creams or suppositories as told.  Exercise regularly.  Go to the bathroom when you  have the urge to have a bowel movement. Do not wait.  Avoid straining to have bowel movements.  Keep the anal area dry and clean. Use wet toilet paper or moist towelettes after a bowel movement.  Do not sit on the toilet for long periods of time. This increases blood pooling and pain. Contact a health care provider if:  You have increasing pain and swelling that are not controlled by treatment or  medicine.  You have uncontrolled bleeding.  You have difficulty having a bowel movement, or you are unable to have a bowel movement.  You have pain or inflammation outside the area of the hemorrhoids. This information is not intended to replace advice given to you by your health care provider. Make sure you discuss any questions you have with your health care provider. Document Released: 05/18/2000 Document Revised: 10/19/2015 Document Reviewed: 02/02/2015 Elsevier Interactive Patient Education  2018 [employer].  Obesity, Adult Obesity is the condition of having too much total body fat. Being overweight or obese means that your weight is greater than what is considered healthy for your body size. Obesity is determined by a measurement called BMI. BMI is an estimate of body fat and is calculated from height and weight. For adults, a BMI of 30 or higher is considered obese. Obesity can eventually lead to other health concerns and major illnesses, including:  Stroke.  Coronary artery disease (CAD).  Type 2 diabetes.  Some types of cancer, including cancers of the colon, breast, uterus, and gallbladder.  Osteoarthritis.  High blood pressure (hypertension).  High cholesterol.  Sleep apnea.  Gallbladder stones.  Infertility problems.  What are the causes? The main cause of obesity is taking in (consuming) more calories than your body uses for energy. Other factors that contribute to this condition may include:  Being born with genes that make you more likely to become obese.  Having a medical condition that causes obesity. These conditions include: ? Hypothyroidism. ? Polycystic ovarian syndrome (PCOS). ? Binge-eating disorder. ? Cushing syndrome.  Taking certain medicines, such as steroids, antidepressants, and seizure medicines.  Not being physically active (sedentary lifestyle).  Living where there are limited places to exercise safely or buy healthy foods.  Not  getting enough sleep.  What increases the risk? The following factors may increase your risk of this condition:  Having a family history of obesity.  Being a woman of African-American descent.  Being a man of Hispanic descent.  What are the signs or symptoms? Having excessive body fat is the main symptom of this condition. How is this diagnosed? This condition may be diagnosed based on:  Your symptoms.  Your medical history.  A physical exam. Your health care provider may measure: ? Your BMI. If you are an adult with a BMI between 25 and less than 30, you are considered overweight. If you are an adult with a BMI of 30 or higher, you are considered obese. ? The distances around your hips and your waist (circumferences). These may be compared to each other to help diagnose your condition. ? Your skinfold thickness. Your health care provider may gently pinch a fold of your skin and measure it.  How is this treated? Treatment for this condition often includes changing your lifestyle. Treatment may include some or all of the following:  Dietary changes. Work with your health care provider and a dietitian to set a weight-loss goal that is healthy and reasonable for you. Dietary changes may include eating: ? Smaller portions.  A portion size is the amount of a particular food that is healthy for you to eat at one time. This varies from person to person. ? Low-calorie or low-fat options. ? More whole grains, fruits, and vegetables.  Regular physical activity. This may include aerobic activity (cardio) and strength training.  Medicine to help you lose weight. Your health care provider may prescribe medicine if you are unable to lose 1 pound a week after 6 weeks of eating more healthily and doing more physical activity.  Surgery. Surgical options may include gastric banding and gastric bypass. Surgery may be done if: ? Other treatments have not helped to improve your condition. ? You have  a BMI of 40 or higher. ? You have life-threatening health problems related to obesity.  Follow these instructions at home:  Eating and drinking   Follow recommendations from your health care provider about what you eat and drink. Your health care provider may advise you to: ? Limit fast foods, sweets, and processed snack foods. ? Choose low-fat options, such as low-fat milk instead of whole milk. ? Eat 5 or more servings of fruits or vegetables every day. ? Eat at home more often. This gives you more control over what you eat. ? Choose healthy foods when you eat out. ? Learn what a healthy portion size is. ? Keep low-fat snacks on hand. ? Avoid sugary drinks, such as soda, fruit juice, iced tea sweetened with sugar, and flavored milk. ? Eat a healthy breakfast.  Drink enough water to keep your urine clear or pale yellow.  Do not go without eating for long periods of time (do not fast) or follow a fad diet. Fasting and fad diets can be unhealthy and even dangerous. Physical Activity  Exercise regularly, as told by your health care provider. Ask your health care provider what types of exercise are safe for you and how often you should exercise.  Warm up and stretch before being active.  Cool down and stretch after being active.  Rest between periods of activity. Lifestyle  Limit the time that you spend in front of your TV, computer, or video game system.  Find ways to reward yourself that do not involve food.  Limit alcohol intake to no more than 1 drink a day for nonpregnant women and 2 drinks a day for men. One drink equals 12 oz of beer, 5 oz of wine, or 1 oz of hard liquor. General instructions  Keep a weight loss journal to keep track of the food you eat and how much you exercise you get.  Take over-the-counter and prescription medicines only as told by your health care provider.  Take vitamins and supplements only as told by your health care provider.  Consider  joining a support group. Your health care provider may be able to recommend a support group.  Keep all follow-up visits as told by your health care provider. This is important. Contact a health care provider if:  You are unable to meet your weight loss goal after 6 weeks of dietary and lifestyle changes. This information is not intended to replace advice given to you by your health care provider. Make sure you discuss any questions you have with your health care provider. Document Released: 06/28/2004 Document Revised: 10/24/2015 Document Reviewed: 03/09/2015 Elsevier Interactive Patient Education  2018 [employer]  Preventing Unhealthy [employer], Adult Staying at a healthy weight is important. When fat builds up in your body, you may become overweight or obese. These  conditions put you at greater risk for developing certain health problems, such as heart disease, diabetes, sleeping problems, joint problems, and some cancers. Unhealthy weight gain is often the result of making unhealthy choices in what you eat. It is also a result of not getting enough exercise. You can make changes to your lifestyle to prevent obesity and stay as healthy as possible. What nutrition changes can be made? To maintain a healthy weight and prevent obesity:  Eat only as much as your body needs. To do this: ? Pay attention to signs that you are hungry or full. Stop eating as soon as you feel full. ? If you feel hungry, try drinking water first. Drink enough water so your urine is clear or pale yellow. ? Eat smaller portions. ? Look at serving sizes on food labels. Most foods contain more than one serving per container. ? Eat the recommended amount of calories for your gender and activity level. While most active people should eat around 2,000 calories per day, if you are trying to lose weight or are not very active, you main need to eat less calories. Talk to your health care provider or dietitian about how many  calories you should eat each day.  Choose healthy foods, such as: ? Fruits and vegetables. Try to fill at least half of your plate at each meal with fruits and vegetables. ? Whole grains, such as whole wheat bread, brown rice, and quinoa. ? Lean meats, such as chicken or fish. ? Other healthy proteins, such as beans, eggs, or tofu. ? Healthy fats, such as nuts, seeds, fatty fish, and olive oil. ? Low-fat or fat-free dairy.  Check food labels and avoid food and drinks that: ? Are high in calories. ? Have added sugar. ? Are high in sodium. ? Have saturated fats or trans fats.  Limit how much you eat of the following foods: ? Prepackaged meals. ? Fast food. ? Fried foods. ? Processed meat, such as bacon, sausage, and deli meats. ? Fatty cuts of red meat and poultry with skin.  Cook foods in healthier ways, such as by baking, broiling, or grilling.  When grocery shopping, try to shop around the outside of the store. This helps you buy mostly fresh foods and avoid canned and prepackaged foods.  What lifestyle changes can be made?  Exercise at least 30 minutes 5 or more days each week. Exercising includes brisk walking, yard work, biking, running, swimming, and team sports like basketball and soccer. Ask your health care provider which exercises are safe for you.  Do not use any products that contain nicotine or tobacco, such as cigarettes and e-cigarettes. If you need help quitting, ask your health care provider.  Limit alcohol intake to no more than 1 drink a day for nonpregnant women and 2 drinks a day for men. One drink equals 12 oz of beer, 5 oz of wine, or 1 oz of hard liquor.  Try to get 7-9 hours of sleep each night. What other changes can be made?  Keep a food and activity journal to keep track of: ? What you ate and how many calories you had. Remember to count sauces, dressings, and side dishes. ? Whether you were active, and what exercises you did. ? Your calorie,  weight, and activity goals.  Check your weight regularly. Track any changes. If you notice you have gained weight, make changes to your diet or activity routine.  Avoid taking weight-loss medicines or supplements. Talk to your  health care provider before starting any new medicine or supplement.  Talk to your health care provider before trying any new diet or exercise plan. Why are these changes important? Eating healthy, staying active, and having healthy habits not only help prevent obesity, they also:  Help you to manage stress and emotions.  Help you to connect with friends and family.  Improve your self-esteem.  Improve your sleep.  Prevent long-term health problems.  What can happen if changes are not made? Being obese or overweight can cause you to develop joint or bone problems, which can make it hard for you to stay active or do activities you enjoy. Being obese or overweight also puts stress on your heart and lungs and can lead to health problems like diabetes, heart disease, and some cancers. Where to find more information: Talk with your health care provider or a dietitian about healthy eating and healthy lifestyle choices. You may also find other information through these resources:  U.[place]. Department of Agriculture MyPlate: https://ball-collins.biz/  American Heart Association: www.heart.org  Centers for Disease Control and Prevention: FootballExhibition.com.br  Summary  Staying at a healthy weight is important. It helps prevent certain diseases and health problems, such as heart disease, diabetes, joint problems, sleep disorders, and some cancers.  Being obese or overweight can cause you to develop joint or bone problems, which can make it hard for you to stay active or do activities you enjoy.  You can prevent unhealthy weight gain by eating a healthy diet, exercising regularly, not smoking, limiting alcohol, and getting enough sleep.  Talk with your health care provider or a  dietitian for guidance about healthy eating and healthy lifestyle choices. This information is not intended to replace advice given to you by your health care provider. Make sure you discuss any questions you have with your health care provider. Document Released: 05/22/2016 Document Revised: 06/27/2016 Document Reviewed: 06/27/2016 Elsevier Interactive Patient Education  [employer].

## 2018-05-16 NOTE — Assessment & Plan Note (Signed)
Encouraged ongoing water intake; encouraged more fruits and veggies; offered referral to nutritionist

## 2018-05-16 NOTE — Progress Notes (Signed)
BP 116/78   Pulse 99   Temp 98.9 F (37.2 C) (Oral)   Ht 5\' 8"  (1.727 m)   Wt 263 lb 12.8 oz (119.7 kg)   SpO2 95%   BMI 40.11 kg/m   MD note: patient was weighed with boots on Subjective:    Patient ID: Jesus Rollsric S Tufaro, male    DOB: 12/25/1980, 37 y.o.   MRN: [id number]  HPI: Jesus Nelson is a 37 y.o. male  Chief Complaint  Patient presents with  . Hemorrhoids    onset 2 weeks using preperation H    HPI Here for hemorrhoids; two weeks duration  Drinking plenty of water; pretty active on his job; weighed today with steel toed boots; not checking steps per day; he has got down to 200 pounds before eating grilled chicken and salads; he says he starts feeling good and then gets burned out  [facility] 05/16/2018 08/05/2015  Decreased Interest 0 0  Down, Depressed, Hopeless 0 0  PHQ - 2 Score 0 0  Altered sleeping 0 -  Tired, decreased energy 0 -  Change in appetite 0 -  Feeling bad or failure about yourself  0 -  Trouble concentrating 0 -  Moving slowly or fidgety/restless 0 -  Suicidal thoughts 0 -  PHQ-9 Score 0 -  Difficult doing work/chores Not difficult at all -   Fall Risk  05/16/2018  Falls in the past year? 0    Relevant past medical, surgical, family and social history reviewed Past Medical History:  Diagnosis Date  . Asthma    seasonal  . Balanitis   . Kidney stones    Past Surgical History:  Procedure Laterality Date  . thumb cyst     Family History  Problem Relation Age of Onset  . Cancer Mother        breast  . Diabetes Maternal Grandmother   . COPD Neg Hx   . Heart disease Neg Hx   . Hypertension Neg Hx   . Stroke Neg Hx   . Bladder Cancer Neg Hx   . Kidney disease Neg Hx   . Prostate cancer Neg Hx   . Sickle cell anemia Neg Hx    Social History   Tobacco Use  . Smoking status: Former Smoker    Years: 3.00    Types: Cigarettes    Last attempt to quit: 06/03/1999    Years since quitting: 18.9  . Smokeless tobacco:  Current User    Types: Chew  Substance Use Topics  . Alcohol use: Yes    Comment: occasional  . Drug use: No     [facility]  AUDIT-C Score  0      Interim medical history since last visit reviewed. Allergies and medications reviewed  Review of Systems Per HPI unless specifically indicated above     Objective:    BP 116/78   Pulse 99   Temp 98.9 F (37.2 C) (Oral)   Ht 5\' 8"  (1.727 m)   Wt 263 lb 12.8 oz (119.7 kg)   SpO2 95%   BMI 40.11 kg/m   Wt Readings from Last 3 Encounters:  05/16/18 263 lb 12.8 oz (119.7 kg)  09/21/16 254 lb 1.6 oz (115.3 kg)  09/15/15 254 lb (115.2 kg)    Physical Exam Constitutional:      General: He is not in acute distress.    Appearance: He is well-developed.  Eyes:  General: No scleral icterus. Cardiovascular:     Rate and Rhythm: Normal rate.  Pulmonary:     Effort: Pulmonary effort is normal.  Abdominal:     General: Bowel sounds are normal. There is no distension.     Palpations: Abdomen is soft.  Genitourinary:    Rectum: External hemorrhoid (tender, dilated hemorrhoid at the 5 o'clock position with patient in the RIGHT lateral decubitus position) present.  Skin:    Coloration: Skin is not pale.  Neurological:     Mental Status: He is alert.       Assessment & Plan:   Problem List Items Addressed This Visit      Other   Morbid obesity (HCC)    Encouraged ongoing water intake; encouraged more fruits and veggies; offered referral to nutritionist       Other Visit Diagnoses    Inflamed external hemorrhoid    -  Primary   start proctocream; avoid straining (with lifting too, demonstrated external jugular distension); refer to Dr. Allegra Lai for banding   Relevant Orders   Ambulatory referral to Gastroenterology      Follow up plan: No follow-ups on file.  An after-visit summary was printed and given to the patient at check-out.  Please see the patient  instructions which may contain other information and recommendations beyond what is mentioned above in the assessment and plan.  Meds ordered this encounter  Medications  . hydrocortisone (ANUSOL-HC) 2.5 % rectal cream    Sig: Place 1 application rectally 2 (two) times daily.    Dispense:  30 g    Refill:  0    Orders Placed This Encounter  Procedures  . Ambulatory referral to Gastroenterology

## 2018-07-04 ENCOUNTER — Ambulatory Visit: Payer: BLUE CROSS/BLUE SHIELD | Admitting: Gastroenterology

## 2018-07-04 ENCOUNTER — Encounter: Payer: Self-pay | Admitting: Gastroenterology

## 2018-07-04 VITALS — BP 136/99 | HR 88 | Resp 17 | Ht 68.0 in | Wt 259.2 lb

## 2018-07-04 DIAGNOSIS — K640 First degree hemorrhoids: Secondary | ICD-10-CM

## 2018-07-04 NOTE — Progress Notes (Signed)
Jesus Repressohini R Masiyah Engen, MD [address]  [address]  [place], [place] [address]  Main: [phone]  Fax: [phone]    Gastroenterology Consultation  Referring Provider:     Kerman PasseyLada, Melinda P, MD Primary Care Physician:  Kerman PasseyLada, Melinda P, MD Primary Gastroenterologist:  Dr. Arlyss Repressohini R Khai Arrona Reason for Consultation:   Symptomatic hemorrhoid        HPI:   Jesus Nelson is a 38 y.o. male referred by Dr. Kerman PasseyLada, Melinda P, MD  for consultation & management of symptomatic hemorrhoid.  Patient saw Dr. Sherie DonLada in mid December 2019 with 2 weeks history of severe rectal pressure, discomfort and scant blood on wiping.  He was prescribed Anusol and referred to me for hemorrhoid ligation.  Patient applied Anusol and it helped to relieve the symptom.  Currently, he does not have symptoms but would like to know about the hemorrhoid ligation.  He works in a [employer] and involves lifting heavy [employer]weights.  Sometimes he strains to have a BM.  NSAIDs: None  Antiplts/Anticoagulants/Anti thrombotics: None  GI Procedures: None He denies known family history of GI malignancy  Past Medical History:  Diagnosis Date  . Asthma    seasonal  . Balanitis   . Kidney stones     Past Surgical History:  Procedure Laterality Date  . thumb cyst      Current Outpatient Medications:  .  fluticasone (FLONASE) 50 MCG/ACT nasal spray, Place 2 sprays into both nostrils daily., Disp: , Rfl:  .  hydrocortisone (ANUSOL-HC) 2.5 % rectal cream, Place 1 application rectally 2 (two) times daily., Disp: 30 g, Rfl: 0 .  albuterol (PROVENTIL HFA;VENTOLIN HFA) 108 (90 Base) MCG/ACT inhaler, Inhale 1 puff into the lungs every 6 (six) hours as needed for wheezing or shortness of breath., Disp: , Rfl:     Family History  Problem Relation Age of Onset  . Cancer Mother        breast  . Diabetes Maternal Grandmother   . COPD Neg Hx   . Heart disease Neg Hx   . Hypertension Neg Hx   . Stroke Neg Hx   . Bladder Cancer Neg Hx   .  Kidney disease Neg Hx   . Prostate cancer Neg Hx   . Sickle cell anemia Neg Hx      Social History   Tobacco Use  . Smoking status: Former Smoker    Years: 3.00    Types: Cigarettes    Last attempt to quit: 06/03/1999    Years since quitting: 19.0  . Smokeless tobacco: Current User    Types: Chew  Substance Use Topics  . Alcohol use: Yes    Comment: occasional  . Drug use: No    Allergies as of 07/04/2018  . (No Known Allergies)    Review of Systems:    All systems reviewed and negative except where noted in HPI.   Physical Exam:  BP (!) 136/99 (BP Location: Left Arm, Patient Position: Sitting, Cuff Size: Large)   Pulse 88   Resp 17   Ht 5\' 8"  (1.727 m)   Wt 259 lb 3.2 oz (117.6 kg)   SpO2 96%   BMI 39.41 kg/m  No LMP for male patient.  General:   Alert,  Well-developed, well-nourished, pleasant and cooperative in NAD Head:  Normocephalic and atraumatic. Eyes:  Sclera clear, no icterus.   Conjunctiva pink. Ears:  Normal auditory acuity. Nose:  No deformity, discharge, or lesions. Mouth:  No deformity or  lesions,oropharynx pink & moist. Neck:  Supple; no masses or thyromegaly. Lungs:  Respirations even and unlabored.  Clear throughout to auscultation.   No wheezes, crackles, or rhonchi. No acute distress. Heart:  Regular rate and rhythm; no murmurs, clicks, rubs, or gallops. Abdomen:  Normal bowel sounds. Soft, non-tender and non-distended without masses, hepatosplenomegaly or hernias noted.  No guarding or rebound tenderness.   Rectal: Normal perianal exam, nontender, no palpable lesions on digital rectal exam or anoscopy Msk:  Symmetrical without gross deformities. Good, equal movement & strength bilaterally. Pulses:  Normal pulses noted. Extremities:  No clubbing or edema.  No cyanosis. Neurologic:  Alert and oriented x3;  grossly normal neurologically. Skin:  Intact without significant lesions or rashes. No jaundice. Lymph Nodes:  No significant cervical  adenopathy. Psych:  Alert and cooperative. Normal mood and affect.  Imaging Studies: None  Assessment and Plan:   Jesus Nelson is a 38 y.o. Caucasian male with morbid obesity, BMI 39.4 with grade 1 symptomatic external hemorrhoids  I discussed with patient about risks and benefits of outpatient hemorrhoid ligation He is agreeable, consent obtained Perform hemorrhoid ligation today   Follow up in 2 weeks   Jesus Repress, MD

## 2018-07-04 NOTE — Progress Notes (Signed)

## 2018-08-08 ENCOUNTER — Ambulatory Visit: Payer: BLUE CROSS/BLUE SHIELD | Admitting: Gastroenterology

## 2018-09-16 ENCOUNTER — Other Ambulatory Visit: Payer: Self-pay | Admitting: Family Medicine

## 2018-09-16 MED ORDER — ALBUTEROL SULFATE HFA 108 (90 BASE) MCG/ACT IN AERS: 1.0000 | INHALATION_SPRAY | RESPIRATORY_TRACT | 1 refills | Status: DC | PRN

## 2019-01-24 DIAGNOSIS — R109 Unspecified abdominal pain: Secondary | ICD-10-CM | POA: Diagnosis not present

## 2019-01-24 DIAGNOSIS — N200 Calculus of kidney: Secondary | ICD-10-CM | POA: Diagnosis not present

## 2019-01-26 ENCOUNTER — Other Ambulatory Visit: Payer: Self-pay

## 2019-01-26 ENCOUNTER — Ambulatory Visit: Payer: BC Managed Care – PPO | Admitting: Physician Assistant

## 2019-01-26 ENCOUNTER — Other Ambulatory Visit: Payer: Self-pay | Admitting: Physician Assistant

## 2019-01-26 ENCOUNTER — Ambulatory Visit
Admission: RE | Admit: 2019-01-26 | Discharge: 2019-01-26 | Disposition: A | Discharge status: Home / Self Care | Payer: BC Managed Care – PPO | Source: Ambulatory Visit | Attending: Physician Assistant | Admitting: Physician Assistant

## 2019-01-26 ENCOUNTER — Encounter: Payer: Self-pay | Admitting: Physician Assistant

## 2019-01-26 VITALS — BP 148/88 | HR 108 | Ht 68.0 in | Wt 252.0 lb

## 2019-01-26 DIAGNOSIS — R109 Unspecified abdominal pain: Secondary | ICD-10-CM | POA: Insufficient documentation

## 2019-01-26 DIAGNOSIS — N201 Calculus of ureter: Secondary | ICD-10-CM | POA: Diagnosis not present

## 2019-01-26 DIAGNOSIS — Z87442 Personal history of urinary calculi: Secondary | ICD-10-CM | POA: Diagnosis not present

## 2019-01-26 DIAGNOSIS — N200 Calculus of kidney: Secondary | ICD-10-CM

## 2019-01-26 MED ORDER — OXYCODONE-ACETAMINOPHEN 5-325 MG PO TABS: 1.0000 | ORAL_TABLET | ORAL | 0 refills | Status: DC | PRN

## 2019-01-26 MED ORDER — ONDANSETRON HCL 4 MG PO TABS: 4.0000 mg | ORAL_TABLET | Freq: Three times a day (TID) | ORAL | 0 refills | Status: AC | PRN

## 2019-01-26 NOTE — H&P (View-Only) (Signed)
01/26/2019 11:54 AM   Jesus RollsEric S Nelson 04/09/1981 [id number]  CC: Left flank pain, nausea, vomiting  HPI: Jesus Rollsric S Nelson is a 38 y.o. male who presents to the clinic for evaluation of possible acute stone episode. He is an established [facility] patient who last saw Dr. Sherryl BartersBudzyn on 09/21/2016 for follow up of balanitis xerotica obliterans and lichen sclerosis.  He reports symptom onset 3 days ago. He reports left flank pain, nausea, and vomiting. He denies fevers, chills, and gross hematuria.   [facility] ago for these symptoms and was treated with Toradol IM, then was discharged with PO Toradol and Flomax. No imaging obtained.  He does have a history of recurrent nephrolithiasis, never requiring surgical intervention.  KUB today with a 4x478mm proximal left ureteral calculus. In-office UA today positive for trace-intact blood and trace protein; urine microscopy negative.  PMH: Past Medical History:  Diagnosis Date  . Asthma    seasonal  . Balanitis   . Kidney stones   . Preventative health care 08/08/2015  . Screening for HIV (human immunodeficiency virus) 08/08/2015  . Sore throat 06/03/2015    Surgical History: Past Surgical History:  Procedure Laterality Date  . thumb cyst      Home Medications:  Allergies as of 01/26/2019   No Known Allergies     Medication List       Accurate as of January 26, 2019 11:59 PM. If you have any questions, ask your nurse or doctor.        albuterol 108 (90 Base) MCG/ACT inhaler Commonly known as: VENTOLIN HFA Inhale 1 puff into the lungs every 4 (four) hours as needed for wheezing or shortness of breath.   fluticasone 50 MCG/ACT nasal spray Commonly known as: FLONASE Place 2 sprays into both nostrils daily.   hydrocortisone 2.5 % rectal cream Commonly known as: ANUSOL-HC Place 1 application rectally 2 (two) times daily.   ketorolac 10 MG tablet Commonly known as: TORADOL Take by mouth.    ondansetron 4 MG tablet Commonly known as: Zofran Take 1 tablet (4 mg total) by mouth every 8 (eight) hours as needed for up to 14 days. Started by: Carman ChingSamantha Amarien Carne, PA-C   oxyCODONE-acetaminophen 5-325 MG tablet Commonly known as: PERCOCET/ROXICET Take 1 tablet by mouth every 4 (four) hours as needed for up to 5 days for severe pain. Started by: Carman ChingSamantha Okla Qazi, PA-C   tamsulosin 0.4 MG Caps capsule Commonly known as: FLOMAX Take by mouth.       Allergies: No Known Allergies  Family History: Family History  Problem Relation Age of Onset  . Cancer Mother        breast  . Diabetes Maternal Grandmother   . COPD Neg Hx   . Heart disease Neg Hx   . Hypertension Neg Hx   . Stroke Neg Hx   . Bladder Cancer Neg Hx   . Kidney disease Neg Hx   . Prostate cancer Neg Hx   . Sickle cell anemia Neg Hx     Social History:  reports that he quit smoking about 19 years ago. His smoking use included cigarettes. He quit after 3.00 years of use. His smokeless tobacco use includes chew. He reports current alcohol use. He reports that he does not use drugs.  ROS: UROLOGY Frequent Urination?: No Hard to postpone urination?: No Burning/pain with urination?: No Get up at night to urinate?: No Leakage of urine?: No Urine stream starts and  stops?: No Trouble starting stream?: No Do you have to strain to urinate?: No Blood in urine?: No Urinary tract infection?: No Sexually transmitted disease?: No Injury to kidneys or bladder?: No Painful intercourse?: No Weak stream?: No Erection problems?: No Penile pain?: No  Gastrointestinal Nausea?: Yes Vomiting?: Yes Indigestion/heartburn?: No Diarrhea?: No Constipation?: No  Constitutional Fever: No Night sweats?: No Weight loss?: No Fatigue?: No  Skin Skin rash/lesions?: No Itching?: No  Eyes Blurred vision?: No Double vision?: No  Ears/Nose/Throat Sore throat?: No Sinus problems?: No  Hematologic/Lymphatic  Swollen glands?: No Easy bruising?: No  Cardiovascular Leg swelling?: No Chest pain?: No  Respiratory Cough?: No Shortness of breath?: No  Endocrine Excessive thirst?: No  Musculoskeletal Back pain?: No Joint pain?: No  Neurological Headaches?: No Dizziness?: No  Psychologic Depression?: No Anxiety?: No  Physical Exam: BP (!) 148/88 (BP Location: Left Arm, Patient Position: Sitting, Cuff Size: Normal)   Pulse (!) 108   Ht 5\' 8"  (1.727 m)   Wt 252 lb (114.3 kg)   BMI 38.32 kg/m   Constitutional:  Alert and oriented, No acute distress. HEENT: [place] AT Cardiovascular: No clubbing, cyanosis, or edema. Respiratory: Normal respiratory effort, no increased work of breathing. Skin: No rashes, bruises or suspicious lesions. Neurologic: Grossly intact, no focal deficits, moving all 4 extremities. Psychiatric: Normal mood and affect.  Laboratory Data: Results for orders placed or performed in visit on 01/26/19  Microscopic Examination   URINE  Result Value Ref Range   WBC, UA 0-5 0 - 5 /hpf   RBC 0-2 0 - 2 /hpf   Epithelial Cells (non renal) 0-10 0 - 10 /hpf   Mucus, UA Present Not Estab.   Bacteria, UA None seen None seen/Few  Urinalysis, Complete  Result Value Ref Range   Specific Gravity, UA 1.020 1.005 - 1.030   pH, UA 6.0 5.0 - 7.5   Color, UA Yellow Yellow   Appearance Ur Clear Clear   Leukocytes,UA Negative Negative   Protein,UA Trace (A) Negative/Trace   Glucose, UA Negative Negative   Ketones, UA Trace (A) Negative   RBC, UA Trace (A) Negative   Bilirubin, UA Negative Negative   Urobilinogen, Ur 0.2 0.2 - 1.0 mg/dL   Nitrite, UA Negative Negative   Microscopic Examination See below:    Pertinent Imaging: KUB 01/26/2019: CLINICAL DATA:  Bilateral flank pain  EXAM: ABDOMEN - 1 VIEW  COMPARISON:  11/17/2008  FINDINGS: 8 mm calcification projects to the left of the L2-3 disc space and could reflect a proximal left ureteral stone. No other  suspicious calcifications. Nonobstructive bowel gas pattern. No organomegaly or free air. No acute bony abnormality.  IMPRESSION: 8 mm calcification to the left of the L2-3 disc space could reflect a proximal left ureteral stone.   Electronically Signed   By: Rolm Baptise M.D.   On: 01/26/2019 18:08  I personally reviewed the images referenced above and note the 23mm left proximal ureteral calculus.  Assessment & Plan:   1. Left flank pain with history of urolithiasis Patient presents with 3-day history of acute, severe left-sided flank pain with nausea and vomiting.  Stone visualized on KUB with approximate dimensions of 4 x 8 mm.  I estimate his chance of spontaneous passage at approximately 50%.  We discussed various treatment options for his stone including trial of passage vs. ESWL vs. ureteroscopy with laser lithotripsy and stent. We discussed the risks and benefits of each including bleeding, infection, damage to surrounding structures, efficacy with  need for possible further intervention, and need for temporary ureteral stent.  Based on this conversation, he would like to proceed with ESWL.  I will plan to schedule this for this coming Thursday.  I counseled him to stop all NSAIDs today in advance of his procedure.  I counseled the patient that stents can cause pain in the flank or groin and/or gross hematuria. I informed him that he may expect to experience these symptoms for the duration of the stent being in place. I counseled him that he should follow up with us urgently if he develops new fever, chills, nausea, or vomiting before it is removed, as these are not typical symptoms associated with a stent. He expressed understanding of this plan.  - Urinalysis, Complete - Urine culture - Zofran 4 mg Q8 PRN - Percocet 5-325mg  Q4 PRN - CT stone study, STAT - ESWL this Thursday  Carman ChingSamantha Loyce Klasen, [facility]  [facility] [address], [address]  [place], [place] [address] [phone]

## 2019-01-26 NOTE — Progress Notes (Signed)
 01/26/2019 11:54 AM   Jesus Nelson 03/28/1981 [id number]  CC: Left flank pain, nausea, vomiting  HPI: Jesus Nelson is a 38 y.o. male who presents to the clinic for evaluation of possible acute stone episode. He is an established [facility] patient who last saw Dr. Budzyn on 09/21/2016 for follow up of balanitis xerotica obliterans and lichen sclerosis.  He reports symptom onset 3 days ago. He reports left flank pain, nausea, and vomiting. He denies fevers, chills, and gross hematuria.   He sought care at [facility] 2 days ago for these symptoms and was treated with Toradol IM, then was discharged with PO Toradol and Flomax. No imaging obtained.  He does have a history of recurrent nephrolithiasis, never requiring surgical intervention.  KUB today with a 4x8mm proximal left ureteral calculus. In-office UA today positive for trace-intact blood and trace protein; urine microscopy negative.  PMH: Past Medical History:  Diagnosis Date  . Asthma    seasonal  . Balanitis   . Kidney stones   . Preventative health care 08/08/2015  . Screening for HIV (human immunodeficiency virus) 08/08/2015  . Sore throat 06/03/2015    Surgical History: Past Surgical History:  Procedure Laterality Date  . thumb cyst      Home Medications:  Allergies as of 01/26/2019   No Known Allergies     Medication List       Accurate as of January 26, 2019 11:59 PM. If you have any questions, ask your nurse or doctor.        albuterol 108 (90 Base) MCG/ACT inhaler Commonly known as: VENTOLIN HFA Inhale 1 puff into the lungs every 4 (four) hours as needed for wheezing or shortness of breath.   fluticasone 50 MCG/ACT nasal spray Commonly known as: FLONASE Place 2 sprays into both nostrils daily.   hydrocortisone 2.5 % rectal cream Commonly known as: ANUSOL-HC Place 1 application rectally 2 (two) times daily.   ketorolac 10 MG tablet Commonly known as: TORADOL Take by mouth.    ondansetron 4 MG tablet Commonly known as: Zofran Take 1 tablet (4 mg total) by mouth every 8 (eight) hours as needed for up to 14 days. Started by: Laurieann Friddle, PA-C   oxyCODONE-acetaminophen 5-325 MG tablet Commonly known as: PERCOCET/ROXICET Take 1 tablet by mouth every 4 (four) hours as needed for up to 5 days for severe pain. Started by: Nare Gaspari, PA-C   tamsulosin 0.4 MG Caps capsule Commonly known as: FLOMAX Take by mouth.       Allergies: No Known Allergies  Family History: Family History  Problem Relation Age of Onset  . Cancer Mother        breast  . Diabetes Maternal Grandmother   . COPD Neg Hx   . Heart disease Neg Hx   . Hypertension Neg Hx   . Stroke Neg Hx   . Bladder Cancer Neg Hx   . Kidney disease Neg Hx   . Prostate cancer Neg Hx   . Sickle cell anemia Neg Hx     Social History:  reports that he quit smoking about 19 years ago. His smoking use included cigarettes. He quit after 3.00 years of use. His smokeless tobacco use includes chew. He reports current alcohol use. He reports that he does not use drugs.  ROS: UROLOGY Frequent Urination?: No Hard to postpone urination?: No Burning/pain with urination?: No Get up at night to urinate?: No Leakage of urine?: No Urine stream starts and   stops?: No Trouble starting stream?: No Do you have to strain to urinate?: No Blood in urine?: No Urinary tract infection?: No Sexually transmitted disease?: No Injury to kidneys or bladder?: No Painful intercourse?: No Weak stream?: No Erection problems?: No Penile pain?: No  Gastrointestinal Nausea?: Yes Vomiting?: Yes Indigestion/heartburn?: No Diarrhea?: No Constipation?: No  Constitutional Fever: No Night sweats?: No Weight loss?: No Fatigue?: No  Skin Skin rash/lesions?: No Itching?: No  Eyes Blurred vision?: No Double vision?: No  Ears/Nose/Throat Sore throat?: No Sinus problems?: No  Hematologic/Lymphatic  Swollen glands?: No Easy bruising?: No  Cardiovascular Leg swelling?: No Chest pain?: No  Respiratory Cough?: No Shortness of breath?: No  Endocrine Excessive thirst?: No  Musculoskeletal Back pain?: No Joint pain?: No  Neurological Headaches?: No Dizziness?: No  Psychologic Depression?: No Anxiety?: No  Physical Exam: BP (!) 148/88 (BP Location: Left Arm, Patient Position: Sitting, Cuff Size: Normal)   Pulse (!) 108   Ht 5\' 8"  (1.727 m)   Wt 252 lb (114.3 kg)   BMI 38.32 kg/m   Constitutional:  Alert and oriented, No acute distress. HEENT: [place] AT Cardiovascular: No clubbing, cyanosis, or edema. Respiratory: Normal respiratory effort, no increased work of breathing. Skin: No rashes, bruises or suspicious lesions. Neurologic: Grossly intact, no focal deficits, moving all 4 extremities. Psychiatric: Normal mood and affect.  Laboratory Data: Results for orders placed or performed in visit on 01/26/19  Microscopic Examination   URINE  Result Value Ref Range   WBC, UA 0-5 0 - 5 /hpf   RBC 0-2 0 - 2 /hpf   Epithelial Cells (non renal) 0-10 0 - 10 /hpf   Mucus, UA Present Not Estab.   Bacteria, UA None seen None seen/Few  Urinalysis, Complete  Result Value Ref Range   Specific Gravity, UA 1.020 1.005 - 1.030   pH, UA 6.0 5.0 - 7.5   Color, UA Yellow Yellow   Appearance Ur Clear Clear   Leukocytes,UA Negative Negative   Protein,UA Trace (A) Negative/Trace   Glucose, UA Negative Negative   Ketones, UA Trace (A) Negative   RBC, UA Trace (A) Negative   Bilirubin, UA Negative Negative   Urobilinogen, Ur 0.2 0.2 - 1.0 mg/dL   Nitrite, UA Negative Negative   Microscopic Examination See below:    Pertinent Imaging: KUB 01/26/2019: CLINICAL DATA:  Bilateral flank pain  EXAM: ABDOMEN - 1 VIEW  COMPARISON:  11/17/2008  FINDINGS: 8 mm calcification projects to the left of the L2-3 disc space and could reflect a proximal left ureteral stone. No other  suspicious calcifications. Nonobstructive bowel gas pattern. No organomegaly or free air. No acute bony abnormality.  IMPRESSION: 8 mm calcification to the left of the L2-3 disc space could reflect a proximal left ureteral stone.   Electronically Signed   By: Rolm Baptise M.D.   On: 01/26/2019 18:08  I personally reviewed the images referenced above and note the 23mm left proximal ureteral calculus.  Assessment & Plan:   1. Left flank pain with history of urolithiasis Patient presents with 3-day history of acute, severe left-sided flank pain with nausea and vomiting.  Stone visualized on KUB with approximate dimensions of 4 x 8 mm.  I estimate his chance of spontaneous passage at approximately 50%.  We discussed various treatment options for his stone including trial of passage vs. ESWL vs. ureteroscopy with laser lithotripsy and stent. We discussed the risks and benefits of each including bleeding, infection, damage to surrounding structures, efficacy with  need for possible further intervention, and need for temporary ureteral stent.  Based on this conversation, he would like to proceed with ESWL.  I will plan to schedule this for this coming Thursday.  I counseled him to stop all NSAIDs today in advance of his procedure.  I counseled the patient that stents can cause pain in the flank or groin and/or gross hematuria. I informed him that he may expect to experience these symptoms for the duration of the stent being in place. I counseled him that he should follow up with us urgently if he develops new fever, chills, nausea, or vomiting before it is removed, as these are not typical symptoms associated with a stent. He expressed understanding of this plan.  - Urinalysis, Complete - Urine culture - Zofran 4 mg Q8 PRN - Percocet 5-325mg  Q4 PRN - CT stone study, STAT - ESWL this Thursday  Carman ChingSamantha Dhwani Venkatesh, [facility]  [facility] [address], [address]  [place], [place] [address] [phone]

## 2019-01-27 ENCOUNTER — Ambulatory Visit
Admission: RE | Admit: 2019-01-27 | Discharge: 2019-01-27 | Disposition: A | Discharge status: Home / Self Care | Payer: BC Managed Care – PPO | Source: Ambulatory Visit | Attending: Physician Assistant | Admitting: Physician Assistant

## 2019-01-27 ENCOUNTER — Other Ambulatory Visit
Admission: RE | Admit: 2019-01-27 | Discharge: 2019-01-27 | Disposition: A | Discharge status: Home / Self Care | Payer: BC Managed Care – PPO | Source: Ambulatory Visit | Attending: Urology | Admitting: Urology

## 2019-01-27 DIAGNOSIS — R109 Unspecified abdominal pain: Secondary | ICD-10-CM | POA: Insufficient documentation

## 2019-01-27 DIAGNOSIS — Z20828 Contact with and (suspected) exposure to other viral communicable diseases: Secondary | ICD-10-CM | POA: Insufficient documentation

## 2019-01-27 DIAGNOSIS — N201 Calculus of ureter: Secondary | ICD-10-CM | POA: Diagnosis not present

## 2019-01-27 DIAGNOSIS — K5730 Diverticulosis of large intestine without perforation or abscess without bleeding: Secondary | ICD-10-CM | POA: Diagnosis not present

## 2019-01-27 DIAGNOSIS — N132 Hydronephrosis with renal and ureteral calculous obstruction: Secondary | ICD-10-CM | POA: Diagnosis not present

## 2019-01-27 DIAGNOSIS — N3289 Other specified disorders of bladder: Secondary | ICD-10-CM | POA: Diagnosis not present

## 2019-01-27 LAB — MICROSCOPIC EXAMINATION: Bacteria, UA: NONE SEEN

## 2019-01-27 LAB — URINALYSIS, COMPLETE
Bilirubin, UA: NEGATIVE
Glucose, UA: NEGATIVE
Leukocytes,UA: NEGATIVE
Nitrite, UA: NEGATIVE
Specific Gravity, UA: 1.02 (ref 1.005–1.030)
Urobilinogen, Ur: 0.2 mg/dL (ref 0.2–1.0)
pH, UA: 6 (ref 5.0–7.5)

## 2019-01-28 LAB — SARS CORONAVIRUS 2 (TAT 6-24 HRS): SARS Coronavirus 2: NEGATIVE

## 2019-01-28 LAB — CULTURE, URINE COMPREHENSIVE

## 2019-01-29 ENCOUNTER — Encounter: Payer: Self-pay | Admitting: *Deleted

## 2019-01-29 ENCOUNTER — Encounter
Admission: RE | Disposition: A | Discharge status: Home / Self Care | Payer: Self-pay | Source: Home / Self Care | Attending: Urology

## 2019-01-29 ENCOUNTER — Other Ambulatory Visit: Payer: Self-pay

## 2019-01-29 ENCOUNTER — Ambulatory Visit
Admission: RE | Admit: 2019-01-29 | Discharge: 2019-01-29 | Disposition: A | Discharge status: Home / Self Care | Payer: BC Managed Care – PPO | Attending: Urology | Admitting: Urology

## 2019-01-29 ENCOUNTER — Ambulatory Visit: Payer: BC Managed Care – PPO

## 2019-01-29 DIAGNOSIS — J45909 Unspecified asthma, uncomplicated: Secondary | ICD-10-CM | POA: Insufficient documentation

## 2019-01-29 DIAGNOSIS — N201 Calculus of ureter: Secondary | ICD-10-CM | POA: Insufficient documentation

## 2019-01-29 DIAGNOSIS — E669 Obesity, unspecified: Secondary | ICD-10-CM | POA: Diagnosis not present

## 2019-01-29 DIAGNOSIS — R109 Unspecified abdominal pain: Secondary | ICD-10-CM | POA: Diagnosis not present

## 2019-01-29 DIAGNOSIS — N200 Calculus of kidney: Secondary | ICD-10-CM

## 2019-01-29 DIAGNOSIS — N202 Calculus of kidney with calculus of ureter: Secondary | ICD-10-CM | POA: Diagnosis not present

## 2019-01-29 DIAGNOSIS — Z87891 Personal history of nicotine dependence: Secondary | ICD-10-CM | POA: Diagnosis not present

## 2019-01-29 DIAGNOSIS — Z6838 Body mass index (BMI) 38.0-38.9, adult: Secondary | ICD-10-CM | POA: Insufficient documentation

## 2019-01-29 HISTORY — PX: EXTRACORPOREAL SHOCK WAVE LITHOTRIPSY: SHX1557

## 2019-01-29 SURGERY — LITHOTRIPSY, ESWL
Anesthesia: Moderate Sedation | Laterality: Left

## 2019-01-29 MED ORDER — SODIUM CHLORIDE 0.9 % IV SOLN
INTRAVENOUS | Status: DC
  Administered 2019-01-29: 08:00:00 via INTRAVENOUS

## 2019-01-29 MED ORDER — CIPROFLOXACIN HCL 500 MG PO TABS
ORAL_TABLET | ORAL | Status: AC
  Administered 2019-01-29: 500 mg via ORAL
  Filled 2019-01-29: qty 1

## 2019-01-29 MED ORDER — OXYCODONE-ACETAMINOPHEN 5-325 MG PO TABS
1.0000 | ORAL_TABLET | Freq: Once | ORAL | Status: AC
  Administered 2019-01-29: 10:00:00 1 via ORAL

## 2019-01-29 MED ORDER — DIPHENHYDRAMINE HCL 25 MG PO CAPS
ORAL_CAPSULE | ORAL | Status: AC
  Administered 2019-01-29: 07:00:00 25 mg via ORAL
  Filled 2019-01-29: qty 1

## 2019-01-29 MED ORDER — ONDANSETRON HCL 4 MG/2ML IJ SOLN
4.0000 mg | Freq: Once | INTRAMUSCULAR | Status: AC
  Administered 2019-01-29: 08:00:00 4 mg via INTRAVENOUS

## 2019-01-29 MED ORDER — ONDANSETRON HCL 4 MG/2ML IJ SOLN
INTRAMUSCULAR | Status: AC
  Administered 2019-01-29: 08:00:00 4 mg via INTRAVENOUS
  Filled 2019-01-29: qty 2

## 2019-01-29 MED ORDER — DIAZEPAM 5 MG PO TABS
ORAL_TABLET | ORAL | Status: AC
  Administered 2019-01-29: 07:00:00 10 mg via ORAL
  Filled 2019-01-29: qty 2

## 2019-01-29 MED ORDER — OXYCODONE-ACETAMINOPHEN 5-325 MG PO TABS: 1.0000 | ORAL_TABLET | ORAL | 0 refills | Status: AC | PRN

## 2019-01-29 MED ORDER — OXYCODONE-ACETAMINOPHEN 5-325 MG PO TABS
ORAL_TABLET | ORAL | Status: AC
  Filled 2019-01-29: qty 1

## 2019-01-29 MED ORDER — DIAZEPAM 5 MG PO TABS
10.0000 mg | ORAL_TABLET | ORAL | Status: AC
  Administered 2019-01-29: 07:00:00 10 mg via ORAL

## 2019-01-29 MED ORDER — CIPROFLOXACIN HCL 500 MG PO TABS
500.0000 mg | ORAL_TABLET | Freq: Once | ORAL | Status: AC
  Administered 2019-01-29: 07:00:00 500 mg via ORAL

## 2019-01-29 MED ORDER — DIPHENHYDRAMINE HCL 25 MG PO CAPS
25.0000 mg | ORAL_CAPSULE | ORAL | Status: AC
  Administered 2019-01-29: 07:00:00 25 mg via ORAL

## 2019-01-29 NOTE — Interval H&P Note (Signed)
History and Physical Interval Note:  01/29/2019 12:45 PM  [name]  has presented today for surgery, with the diagnosis of Kidney stone.  The various methods of treatment have been discussed with the patient and family. After consideration of risks, benefits and other options for treatment, the patient has consented to  Procedure(s): EXTRACORPOREAL SHOCK WAVE LITHOTRIPSY (ESWL) (Left) as a surgical intervention.  The patient's history has been reviewed, patient examined, no change in status, stable for surgery.  I have reviewed the patient's chart and labs.  Questions were answered to the patient's satisfaction.     Hollice Espy

## 2019-01-29 NOTE — Discharge Instructions (Signed)
See [facility] discharge instructions in chart.  AMBULATORY SURGERY  DISCHARGE INSTRUCTIONS   1) The drugs that you were given will stay in your system until tomorrow so for the next 24 hours you should not:  A) Drive an automobile B) Make any legal decisions C) Drink any alcoholic beverage   2) You may resume regular meals tomorrow.  Today it is better to start with liquids and gradually work up to solid foods.  You may eat anything you prefer, but it is better to start with liquids, then soup and crackers, and gradually work up to solid foods.   3) Please notify your doctor immediately if you have any unusual bleeding, trouble breathing, redness and pain at the surgery site, drainage, fever, or pain not relieved by medication.    4) Additional Instructions:        Please contact your physician with any problems or Same Day Surgery at [phone], Monday through Friday 6 am to 4 pm, or [place] [facility] Main number at [phone].  

## 2019-01-30 ENCOUNTER — Encounter: Payer: Self-pay | Admitting: Urology

## 2019-02-02 ENCOUNTER — Other Ambulatory Visit: Admission: RE | Admit: 2019-02-02 | Payer: BC Managed Care – PPO | Source: Ambulatory Visit

## 2019-02-12 ENCOUNTER — Ambulatory Visit: Payer: BC Managed Care – PPO | Admitting: Physician Assistant

## 2019-02-12 ENCOUNTER — Encounter: Payer: Self-pay | Admitting: Physician Assistant

## 2019-02-12 ENCOUNTER — Ambulatory Visit
Admission: RE | Admit: 2019-02-12 | Discharge: 2019-02-12 | Disposition: A | Discharge status: Home / Self Care | Payer: BC Managed Care – PPO | Attending: Urology | Admitting: Urology

## 2019-02-12 ENCOUNTER — Other Ambulatory Visit: Payer: Self-pay

## 2019-02-12 ENCOUNTER — Ambulatory Visit
Admission: RE | Admit: 2019-02-12 | Discharge: 2019-02-12 | Disposition: A | Discharge status: Home / Self Care | Payer: BC Managed Care – PPO | Source: Ambulatory Visit | Attending: Urology | Admitting: Urology

## 2019-02-12 VITALS — BP 129/88 | HR 98 | Ht 68.0 in | Wt 249.0 lb

## 2019-02-12 DIAGNOSIS — N201 Calculus of ureter: Secondary | ICD-10-CM | POA: Diagnosis not present

## 2019-02-12 DIAGNOSIS — N202 Calculus of kidney with calculus of ureter: Secondary | ICD-10-CM | POA: Diagnosis not present

## 2019-02-12 NOTE — Progress Notes (Signed)
02/12/2019 2:33 PM   Jesus RollsEric S Nelson 10/09/1980 [id number]  CC: F/u 2-weeks s/p ESWL  HPI: Jesus Nelson is a 38 y.o. male who presents to the clinic for 2-week follow-up of ESWL for a 4x277mm proximal left ureteral stone.  He reports he has only passed a couple of tiny stone fragements in the interim. He is straining his urine, taking tamsulosin, and drinking extra water. He denies fevers, chills, nausea, vomiting, flank pain, and gross hematuria.   He reports overall feeling better than he did when he first presented with his acute stone episode, but notes some left flank pain every couple of days that "feels like I'm about to pass something." Discomfort relieved with ibuprofen.  Of note, today's KUB revealed migration of the stone to the distal left ureter.  PMH: Past Medical History:  Diagnosis Date  . Asthma    seasonal  . Balanitis   . Kidney stones   . Preventative health care 08/08/2015  . Screening for HIV (human immunodeficiency virus) 08/08/2015  . Sore throat 06/03/2015    Surgical History: Past Surgical History:  Procedure Laterality Date  . EXTRACORPOREAL SHOCK WAVE LITHOTRIPSY Left 01/29/2019   Procedure: EXTRACORPOREAL SHOCK WAVE LITHOTRIPSY (ESWL);  Surgeon: Vanna ScotlandBrandon, Ashley, MD;  Location: [facility] ORS;  Service: Urology;  Laterality: Left;  . thumb cyst      Home Medications:  Allergies as of 02/12/2019   No Known Allergies     Medication List       Accurate as of February 12, 2019 11:59 PM. If you have any questions, ask your nurse or doctor.        STOP taking these medications   hydrocortisone 2.5 % rectal cream Commonly known as: ANUSOL-HC Stopped by: Carman ChingSamantha Shaunte Tuft, PA-C     TAKE these medications   albuterol 108 (90 Base) MCG/ACT inhaler Commonly known as: VENTOLIN HFA Inhale 1 puff into the lungs every 4 (four) hours as needed for wheezing or shortness of breath.   fluticasone 50 MCG/ACT nasal spray Commonly known as: FLONASE Place 2  sprays into both nostrils daily.   tamsulosin 0.4 MG Caps capsule Commonly known as: FLOMAX Take by mouth.       Allergies: No Known Allergies  Family History: Family History  Problem Relation Age of Onset  . Cancer Mother        breast  . Diabetes Maternal Grandmother   . COPD Neg Hx   . Heart disease Neg Hx   . Hypertension Neg Hx   . Stroke Neg Hx   . Bladder Cancer Neg Hx   . Kidney disease Neg Hx   . Prostate cancer Neg Hx   . Sickle cell anemia Neg Hx     Social History:  reports that he quit smoking about 19 years ago. His smoking use included cigarettes. He quit after 3.00 years of use. His smokeless tobacco use includes chew. He reports current alcohol use. He reports that he does not use drugs.  ROS: UROLOGY Frequent Urination?: No Hard to postpone urination?: No Burning/pain with urination?: No Get up at night to urinate?: Yes Leakage of urine?: No Urine stream starts and stops?: No Trouble starting stream?: No Do you have to strain to urinate?: No Blood in urine?: No Urinary tract infection?: No Sexually transmitted disease?: No Injury to kidneys or bladder?: No Painful intercourse?: No Weak stream?: No Erection problems?: No Penile pain?: No  Gastrointestinal Nausea?: No Vomiting?: No Indigestion/heartburn?: No Diarrhea?: No Constipation?: No  Constitutional Fever: No Night sweats?: No Weight loss?: No Fatigue?: No  Skin Skin rash/lesions?: No Itching?: No  Eyes Blurred vision?: No Double vision?: No  Ears/Nose/Throat Sore throat?: No Sinus problems?: No  Hematologic/Lymphatic Swollen glands?: No Easy bruising?: No  Cardiovascular Leg swelling?: No Chest pain?: No  Respiratory Cough?: No Shortness of breath?: No  Endocrine Excessive thirst?: No  Musculoskeletal Back pain?: No Joint pain?: No  Neurological Headaches?: No Dizziness?: No  Psychologic Depression?: No Anxiety?: No  Physical Exam: BP 129/88  (BP Location: Left Arm, Patient Position: Sitting, Cuff Size: Normal)   Pulse 98   Ht 5\' 8"  (1.727 m)   Wt 249 lb (112.9 kg)   BMI 37.86 kg/m   Constitutional:  Alert and oriented, No acute distress. HEENT: [place] AT, moist mucus membranes.  Trachea midline, no masses. Cardiovascular: No clubbing, cyanosis, or edema. Respiratory: Normal respiratory effort, no increased work of breathing. Skin: No rashes, bruises or suspicious lesions. Neurologic: Grossly intact, no focal deficits, moving all 4 extremities. Psychiatric: Normal mood and affect.  Pertinent Imaging: KUB 02/12/2019: CLINICAL DATA:  Status post lithotripsy, history of left ureteral stone  EXAM: ABDOMEN - 1 VIEW  COMPARISON:  01/29/2019  FINDINGS: Scattered large and small bowel gas is noted. No abnormal mass is noted. The previously seen proximal left ureteral stone is noted distally at the UVJ slightly smaller than that seen on the prior exam. A small lower pole renal stone is noted on the left as well not as well appreciated on the prior study. This may represent migration of a fragment of the proximal ureteral stone.  IMPRESSION: Distal left ureteral stone is noted somewhat smaller than that seen on the prior exam. A lower pole left renal stone is seen which was not noted on the prior exam and may represent a fragment migrated into the kidney.   Electronically Signed   By: Inez Catalina M.D.   On: 02/12/2019 15:16  I personally reviewed the images referenced above and note migration of the known left ureteral stone into the pelvis.  Assessment & Plan:   1. Left ureteral stone Patient with migrated ureteral stone s/p ESWL. He is comfortable with occasional discomfort that he attributes to imminent stone passage. He denies infective symptoms and gross hematuria.  I would like to give him another 2 weeks to pass the stone given its significant migration since ESWL. I provided him with a copy of the ABCs of  Kidney Stones educational booklet today. I counseled him to continue tamsulosin, urine straining, and increased water intake over the next two weeks. If he passes the stone before his next scheduled appointment, I advised him to bring the stone to the office for analysis. He is in agreement with this plan.  - Continue tamsulosin, urine straining, and increased water intake - DG Abd 1 View; Future  Debroah Loop, PA-C  [facility] [address] [place], [place] [address] [phone]

## 2019-02-26 ENCOUNTER — Other Ambulatory Visit: Payer: Self-pay

## 2019-02-26 ENCOUNTER — Ambulatory Visit
Admission: RE | Admit: 2019-02-26 | Discharge: 2019-02-26 | Disposition: A | Discharge status: Home / Self Care | Payer: BC Managed Care – PPO | Source: Ambulatory Visit | Attending: Physician Assistant | Admitting: Physician Assistant

## 2019-02-26 ENCOUNTER — Ambulatory Visit (INDEPENDENT_AMBULATORY_CARE_PROVIDER_SITE_OTHER): Payer: BC Managed Care – PPO | Admitting: Physician Assistant

## 2019-02-26 ENCOUNTER — Encounter: Payer: Self-pay | Admitting: Physician Assistant

## 2019-02-26 VITALS — BP 126/91 | HR 90 | Ht 68.0 in | Wt 249.0 lb

## 2019-02-26 DIAGNOSIS — N201 Calculus of ureter: Secondary | ICD-10-CM | POA: Insufficient documentation

## 2019-02-26 DIAGNOSIS — N400 Enlarged prostate without lower urinary tract symptoms: Secondary | ICD-10-CM | POA: Diagnosis not present

## 2019-02-26 DIAGNOSIS — N202 Calculus of kidney with calculus of ureter: Secondary | ICD-10-CM | POA: Diagnosis not present

## 2019-02-26 NOTE — Progress Notes (Signed)
02/26/2019 3:33 PM   Jesus Nelson [birth year] [id number]  CC: 4 week f/u s/p ESWL  HPI: Jesus Nelson is a 38 y.o. male who presents today for 4-week follow-up s/p ESWL for a 4 x 8 mm left ureteral stone.  He was last seen by me 2 weeks ago for his first follow-up visit; KUB prior to this appointment revealed migration of the stone from the proximal to the distal left ureter.  Patient reports passing a stone 1 week ago.  He states that his pain has since resolved.  He denies dysuria and gross hematuria.  He has brought the stone with him today.  KUB today with resolution of the distal left ureteral stone.  PMH: Past Medical History:  Diagnosis Date   Asthma    seasonal   Balanitis    Kidney stones    Preventative health care 08/08/2015   Screening for HIV (human immunodeficiency virus) 08/08/2015   Sore throat 06/03/2015    Surgical History: Past Surgical History:  Procedure Laterality Date   EXTRACORPOREAL SHOCK WAVE LITHOTRIPSY Left 01/29/2019   Procedure: EXTRACORPOREAL SHOCK WAVE LITHOTRIPSY (ESWL);  Surgeon: Vanna Scotland, MD;  Location: [facility] ORS;  Service: Urology;  Laterality: Left;   thumb cyst      Home Medications:  Allergies as of 02/26/2019   No Known Allergies     Medication List       Accurate as of February 26, 2019  3:33 PM. If you have any questions, ask your nurse or doctor.        albuterol 108 (90 Base) MCG/ACT inhaler Commonly known as: VENTOLIN HFA Inhale 1 puff into the lungs every 4 (four) hours as needed for wheezing or shortness of breath.   fluticasone 50 MCG/ACT nasal spray Commonly known as: FLONASE Place 2 sprays into both nostrils daily.   tamsulosin 0.4 MG Caps capsule Commonly known as: FLOMAX Take by mouth.       Allergies:  No Known Allergies  Family History: Family History  Problem Relation Age of Onset   Cancer Mother        breast   Diabetes Maternal Grandmother    COPD Neg Hx    Heart disease Neg  Hx    Hypertension Neg Hx    Stroke Neg Hx    Bladder Cancer Neg Hx    Kidney disease Neg Hx    Prostate cancer Neg Hx    Sickle cell anemia Neg Hx     Social History:   reports that he quit smoking about 19 years ago. His smoking use included cigarettes. He quit after 3.00 years of use. His smokeless tobacco use includes chew. He reports current alcohol use. He reports that he does not use drugs.  ROS: UROLOGY Frequent Urination?: No Hard to postpone urination?: No Burning/pain with urination?: No Get up at night to urinate?: No Leakage of urine?: No Urine stream starts and stops?: No Trouble starting stream?: No Do you have to strain to urinate?: No Blood in urine?: No Urinary tract infection?: No Sexually transmitted disease?: No Injury to kidneys or bladder?: No Painful intercourse?: No Weak stream?: No Erection problems?: No Penile pain?: No  Gastrointestinal Nausea?: No Vomiting?: No Indigestion/heartburn?: No Diarrhea?: No Constipation?: No  Constitutional Fever: No Night sweats?: No Weight loss?: No Fatigue?: No  Skin Skin rash/lesions?: No Itching?: No  Eyes Blurred vision?: No Double vision?: No  Ears/Nose/Throat Sore throat?: No Sinus problems?: No  Hematologic/Lymphatic Swollen glands?: No Easy bruising?:  No  Cardiovascular Leg swelling?: No Chest pain?: No  Respiratory Cough?: No Shortness of breath?: No  Endocrine Excessive thirst?: No  Musculoskeletal Back pain?: No Joint pain?: No  Neurological Headaches?: No Dizziness?: No  Psychologic Depression?: No Anxiety?: No  Physical Exam: BP (!) 126/91 (BP Location: Left Arm, Patient Position: Sitting, Cuff Size: Normal)    Pulse 90    Ht 5\' 8"  (1.727 m)    Wt 249 lb (112.9 kg)    BMI 37.86 kg/m   Constitutional:  Alert and oriented, no acute distress, nontoxic appearing HEENT: [place], AT Cardiovascular: No clubbing, cyanosis, or edema Respiratory: Normal respiratory  effort, no increased work of breathing Skin: No rashes, bruises or suspicious lesions Neurologic: Grossly intact, no focal deficits, moving all 4 extremities Psychiatric: Normal mood and affect  Pertinent Imaging: KUB 02/26/2019: CLINICAL DATA:  Left side ureteral stone was passed x 7 days ago Lithotripsy (left)- 01/29/2019  EXAM: ABDOMEN - 1 VIEW  COMPARISON:  Abdominal radiograph 02/12/2019  FINDINGS: The previously seen stone at the left UVJ is no longer present. A 5 mm radiopaque density projects over the inferior pole the left kidney similar to prior. No right ureteral calculi are identified.  Nonobstructive bowel gas pattern. No evidence of free air. The visualized skeleton is unremarkable.  IMPRESSION: 1. The previously seen stone at the left UVJ is no longer present, likely passed. 2. 5 mm probable calculus projects over the inferior pole of the left kidney, similar to prior.   Electronically Signed   By: Audie Pinto M.D.   On: 02/27/2019 08:44  I personally reviewed the images referenced above and note resolution of the distal left ureteral stone.  Assessment & Plan:   1. Left ureteral stone Patient reports having passed a stone 1 week ago.  He brought a specimen with him today that resembles the oblong shape of his known left ureteral stone.  Additionally, there is no stone visualized on KUB today.  I believe he has passed the offending stone.  I would like to proceed with renal ultrasound to prove resolution of his mild left hydronephrosis.  He is in agreement with this plan.  Will send stone for analysis.  Per CT stone study on 01/27/2019, he does have an additional nonobstructing left lower pole renal stone.  No follow-up needed for surveillance of this unless he becomes symptomatic. - US RENAL  Oreta Soloway, [place] [address], [place], [place] [address] [phone]

## 2019-03-05 ENCOUNTER — Telehealth: Payer: Self-pay | Admitting: Physician Assistant

## 2019-03-05 NOTE — Telephone Encounter (Signed)
Called [facility], added test number for stone analysis

## 2019-03-05 NOTE — Telephone Encounter (Signed)
Matt from [facility] called and needs a return call, he received a specimen for this pt but no test was marked.

## 2019-03-09 ENCOUNTER — Telehealth: Payer: Self-pay | Admitting: Physician Assistant

## 2019-03-09 NOTE — Telephone Encounter (Signed)
Spoke to [facility] and confirmed the order is for a stone analysis.

## 2019-03-09 NOTE — Telephone Encounter (Signed)
Matt from [facility] called and needs a call back concerning pt's Stone test that was sent. He states that the test was not checked on the box. Please call back at [phone]

## 2019-03-12 ENCOUNTER — Telehealth: Payer: Self-pay | Admitting: Physician Assistant

## 2019-03-12 ENCOUNTER — Other Ambulatory Visit: Payer: Self-pay | Admitting: Physician Assistant

## 2019-03-12 NOTE — Telephone Encounter (Signed)
Called patient to report results of stone analysis.  LMOM to return call.

## 2019-03-16 NOTE — Telephone Encounter (Signed)
Patient returned your call   Sharyn Lull

## 2019-03-17 NOTE — Telephone Encounter (Signed)
I just spoke with the patient via telephone to report his stone analysis results.  I explained that his stone was 99% calcium oxalate and 1% calcium phosphate.  I reiterated stone prevention recommendations including increasing oral hydration, maintaining a low-sodium diet, maintaining a low oxalate diet, and maintaining a moderate calcium diet.  I recommended increasing his intake of non-animal proteins, including beans and soy products.  He expressed understanding.  Patient does have a family history of recurrent nephrolithiasis.  I offered him a metabolic work-up at this time, however I explained that this may not reveal any new information in terms of stone prevention recommendations or management.  I encouraged him to consider this option.  He does not wish to proceed immediately with this.

## 2020-04-12 ENCOUNTER — Other Ambulatory Visit: Payer: Self-pay

## 2020-04-12 ENCOUNTER — Ambulatory Visit (INDEPENDENT_AMBULATORY_CARE_PROVIDER_SITE_OTHER): Payer: BC Managed Care – PPO | Admitting: Physician Assistant

## 2020-04-12 ENCOUNTER — Encounter: Payer: Self-pay | Admitting: Physician Assistant

## 2020-04-12 VITALS — BP 135/88 | HR 87 | Ht 67.0 in | Wt 255.0 lb

## 2020-04-12 DIAGNOSIS — R350 Frequency of micturition: Secondary | ICD-10-CM | POA: Diagnosis not present

## 2020-04-12 LAB — URINALYSIS, COMPLETE
Bilirubin, UA: NEGATIVE
Glucose, UA: NEGATIVE
Ketones, UA: NEGATIVE
Leukocytes,UA: NEGATIVE
Nitrite, UA: NEGATIVE
Protein,UA: NEGATIVE
RBC, UA: NEGATIVE
Specific Gravity, UA: >1.03 — ABNORMAL HIGH (ref 1.005–1.030)
Urobilinogen, Ur: 0.2 mg/dL (ref 0.2–1.0)
pH, UA: 5.5 (ref 5.0–7.5)

## 2020-04-12 LAB — MICROSCOPIC EXAMINATION: Bacteria, UA: NONE SEEN

## 2020-04-12 LAB — BLADDER SCAN AMB NON-IMAGING
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
## 2020-04-12 NOTE — Patient Instructions (Addendum)
I'd like you to see Dr. Apolinar Junes for a cystoscopy to evaluate you for a possible urethral stricture. More information about this test is below.  In the meantime, please stop drinking fluids after dinner. Additionally, I'd like you to avoid caffeine after 2pm and be mindful of your urgency and frequency when you drink caffeinated beverages versus water.

## 2020-04-27 ENCOUNTER — Other Ambulatory Visit: Payer: Self-pay

## 2020-04-27 ENCOUNTER — Ambulatory Visit (INDEPENDENT_AMBULATORY_CARE_PROVIDER_SITE_OTHER): Payer: BC Managed Care – PPO | Admitting: Urology

## 2020-04-27 ENCOUNTER — Encounter: Payer: Self-pay | Admitting: Urology

## 2020-04-27 VITALS — BP 126/86 | HR 79 | Ht 68.0 in | Wt 246.0 lb

## 2020-04-27 DIAGNOSIS — R350 Frequency of micturition: Secondary | ICD-10-CM

## 2020-04-27 MED ORDER — OXYBUTYNIN CHLORIDE ER 15 MG PO TB24: 15.0000 mg | ORAL_TABLET | Freq: Every day | ORAL | 3 refills | Status: DC
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
## 2020-05-03 LAB — URINALYSIS, COMPLETE
Bilirubin, UA: NEGATIVE
Glucose, UA: NEGATIVE
Ketones, UA: NEGATIVE
Leukocytes,UA: NEGATIVE
Nitrite, UA: NEGATIVE
Protein,UA: NEGATIVE
Specific Gravity, UA: 1.025 (ref 1.005–1.030)
Urobilinogen, Ur: 0.2 mg/dL (ref 0.2–1.0)
pH, UA: 5.5 (ref 5.0–7.5)

## 2020-05-03 LAB — MICROSCOPIC EXAMINATION
Bacteria, UA: NONE SEEN
WBC, UA: NONE SEEN /hpf (ref 0–5)

## 2020-05-31 ENCOUNTER — Encounter: Payer: Self-pay | Admitting: Physician Assistant

## 2020-05-31 ENCOUNTER — Other Ambulatory Visit: Payer: Self-pay

## 2020-05-31 ENCOUNTER — Ambulatory Visit (INDEPENDENT_AMBULATORY_CARE_PROVIDER_SITE_OTHER): Payer: BC Managed Care – PPO | Admitting: Physician Assistant

## 2020-05-31 VITALS — BP 134/89 | HR 85 | Ht 69.0 in | Wt 263.0 lb

## 2020-05-31 DIAGNOSIS — R350 Frequency of micturition: Secondary | ICD-10-CM

## 2020-05-31 DIAGNOSIS — N480 Leukoplakia of penis: Secondary | ICD-10-CM

## 2020-05-31 LAB — BLADDER SCAN AMB NON-IMAGING

## 2020-05-31 MED ORDER — CLOBETASOL PROPIONATE 0.05 % EX CREA: 1.0000 "application " | TOPICAL_CREAM | Freq: Two times a day (BID) | CUTANEOUS | 2 refills | Status: AC

## 2020-05-31 MED ORDER — OXYBUTYNIN CHLORIDE ER 15 MG PO TB24: 15.0000 mg | ORAL_TABLET | Freq: Every day | ORAL | 11 refills | Status: DC

## 2020-05-31 NOTE — Progress Notes (Signed)
05/31/2020 4:03 PM   [name] [birth year] [id number]  CC: Chief Complaint  Patient presents with  . Urinary Frequency    HPI: Jesus Nelson is a 39 y.o. male with PMH BXO, nephrolithiasis, and urinary frequency, urgency, and nocturia who presents today for symptom recheck on oxybutynin XL 15 mg daily.  He underwent cystoscopy with Dr. Apolinar Junes on 04/27/2020 with no significant findings.  Today he reports significant improvement of his bothersome frequency, urgency, and nocturia on oxybutynin.  He reports nocturia x1, previously 4-5, and states his quality of sleep has greatly improved as a result of this.  He has had some mild dry mouth isolated to the mornings.  He denies dry eye or constipation.  Additionally, he requests a refill of clobetasol cream for management of BXO flares.  He applies the cream topically twice daily for 1 week when needed.  PVR 87mL.  PMH: Past Medical History:  Diagnosis Date  . Asthma    seasonal  . Balanitis   . Kidney stones   . Preventative health care 08/08/2015  . Screening for HIV (human immunodeficiency virus) 08/08/2015  . Sore throat 06/03/2015    Surgical History: Past Surgical History:  Procedure Laterality Date  . EXTRACORPOREAL SHOCK WAVE LITHOTRIPSY Left 01/29/2019   Procedure: EXTRACORPOREAL SHOCK WAVE LITHOTRIPSY (ESWL);  Surgeon: Vanna Scotland, MD;  Location: [facility] ORS;  Service: Urology;  Laterality: Left;  . thumb cyst      Home Medications:  Allergies as of 05/31/2020   No Known Allergies     Medication List       Accurate as of May 31, 2020  4:03 PM. If you have any questions, ask your nurse or doctor.        albuterol 108 (90 Base) MCG/ACT inhaler Commonly known as: VENTOLIN HFA Inhale 1 puff into the lungs every 4 (four) hours as needed for wheezing or shortness of breath.   clobetasol cream 0.05 % Commonly known as: TEMOVATE Apply 1 application topically 2 (two) times daily. Started by: Carman Ching, PA-C   fluticasone 50 MCG/ACT nasal spray Commonly known as: FLONASE Place 2 sprays into both nostrils daily.   oxybutynin 15 MG 24 hr tablet Commonly known as: DITROPAN XL Take 1 tablet (15 mg total) by mouth daily.       Allergies:  No Known Allergies  Family History: Family History  Problem Relation Age of Onset  . Cancer Mother        breast  . Diabetes Maternal Grandmother   . COPD Neg Hx   . Heart disease Neg Hx   . Hypertension Neg Hx   . Stroke Neg Hx   . Bladder Cancer Neg Hx   . Kidney disease Neg Hx   . Prostate cancer Neg Hx   . Sickle cell anemia Neg Hx     Social History:   reports that he quit smoking about 21 years ago. His smoking use included cigarettes. He quit after 3.00 years of use. His smokeless tobacco use includes chew. He reports current alcohol use. He reports that he does not use drugs.  Physical Exam: BP 134/89 (BP Location: Left Arm, Patient Position: Sitting, Cuff Size: Large)   Pulse 85   Ht 5\' 9"  (1.753 m)   Wt 263 lb (119.3 kg)   BMI 38.84 kg/m   Constitutional:  Alert and oriented, no acute distress, nontoxic appearing HEENT: [place], AT Cardiovascular: No clubbing, cyanosis, or edema Respiratory: Normal respiratory effort, no increased  work of breathing Skin: No rashes, bruises or suspicious lesions Neurologic: Grossly intact, no focal deficits, moving all 4 extremities Psychiatric: Normal mood and affect  Laboratory Data: Results for orders placed or performed in visit on 05/31/20  Bladder Scan (Post Void Residual) in office  Result Value Ref Range   Scan Result 49mL    Assessment & Plan:   1. Urinary frequency Significant improvement in bothersome OAB type symptoms on oxybutynin XL 15 mg.  We will plan to continue this with symptom recheck and PVR in 1 year.  PVR WNL today. - Bladder Scan (Post Void Residual) in office - oxybutynin (DITROPAN XL) 15 MG 24 hr tablet; Take 1 tablet (15 mg total) by mouth daily.   Dispense: 30 tablet; Refill: 11  2. BXO (balanitis xerotica obliterans) Refilling clobetasol cream per patient request.  Reinforced that he should use this twice daily for a maximum of 1 week at a time.  He expressed understanding. - clobetasol cream (TEMOVATE) 0.05 %; Apply 1 application topically 2 (two) times daily.  Dispense: 30 g; Refill: 2  Return in about 1 year (around 05/31/2021) for Symptom recheck with IPSS, PVR.  Carman Ching, PA-C  [facility] [address] [place], [place] [address] [phone]

## 2020-06-10 IMAGING — CR DG ABDOMEN 1V
1 series · 2 of 2 positions shown · non-contrast
Comparison: 01/29/2019

CLINICAL DATA: Status post lithotripsy, history of left ureteral
stone

EXAM:
ABDOMEN - 1 VIEW

[Series 1: dg abd 1 view · 0.14mm/px · 2 of 2 slices shown]
[im 1/2]
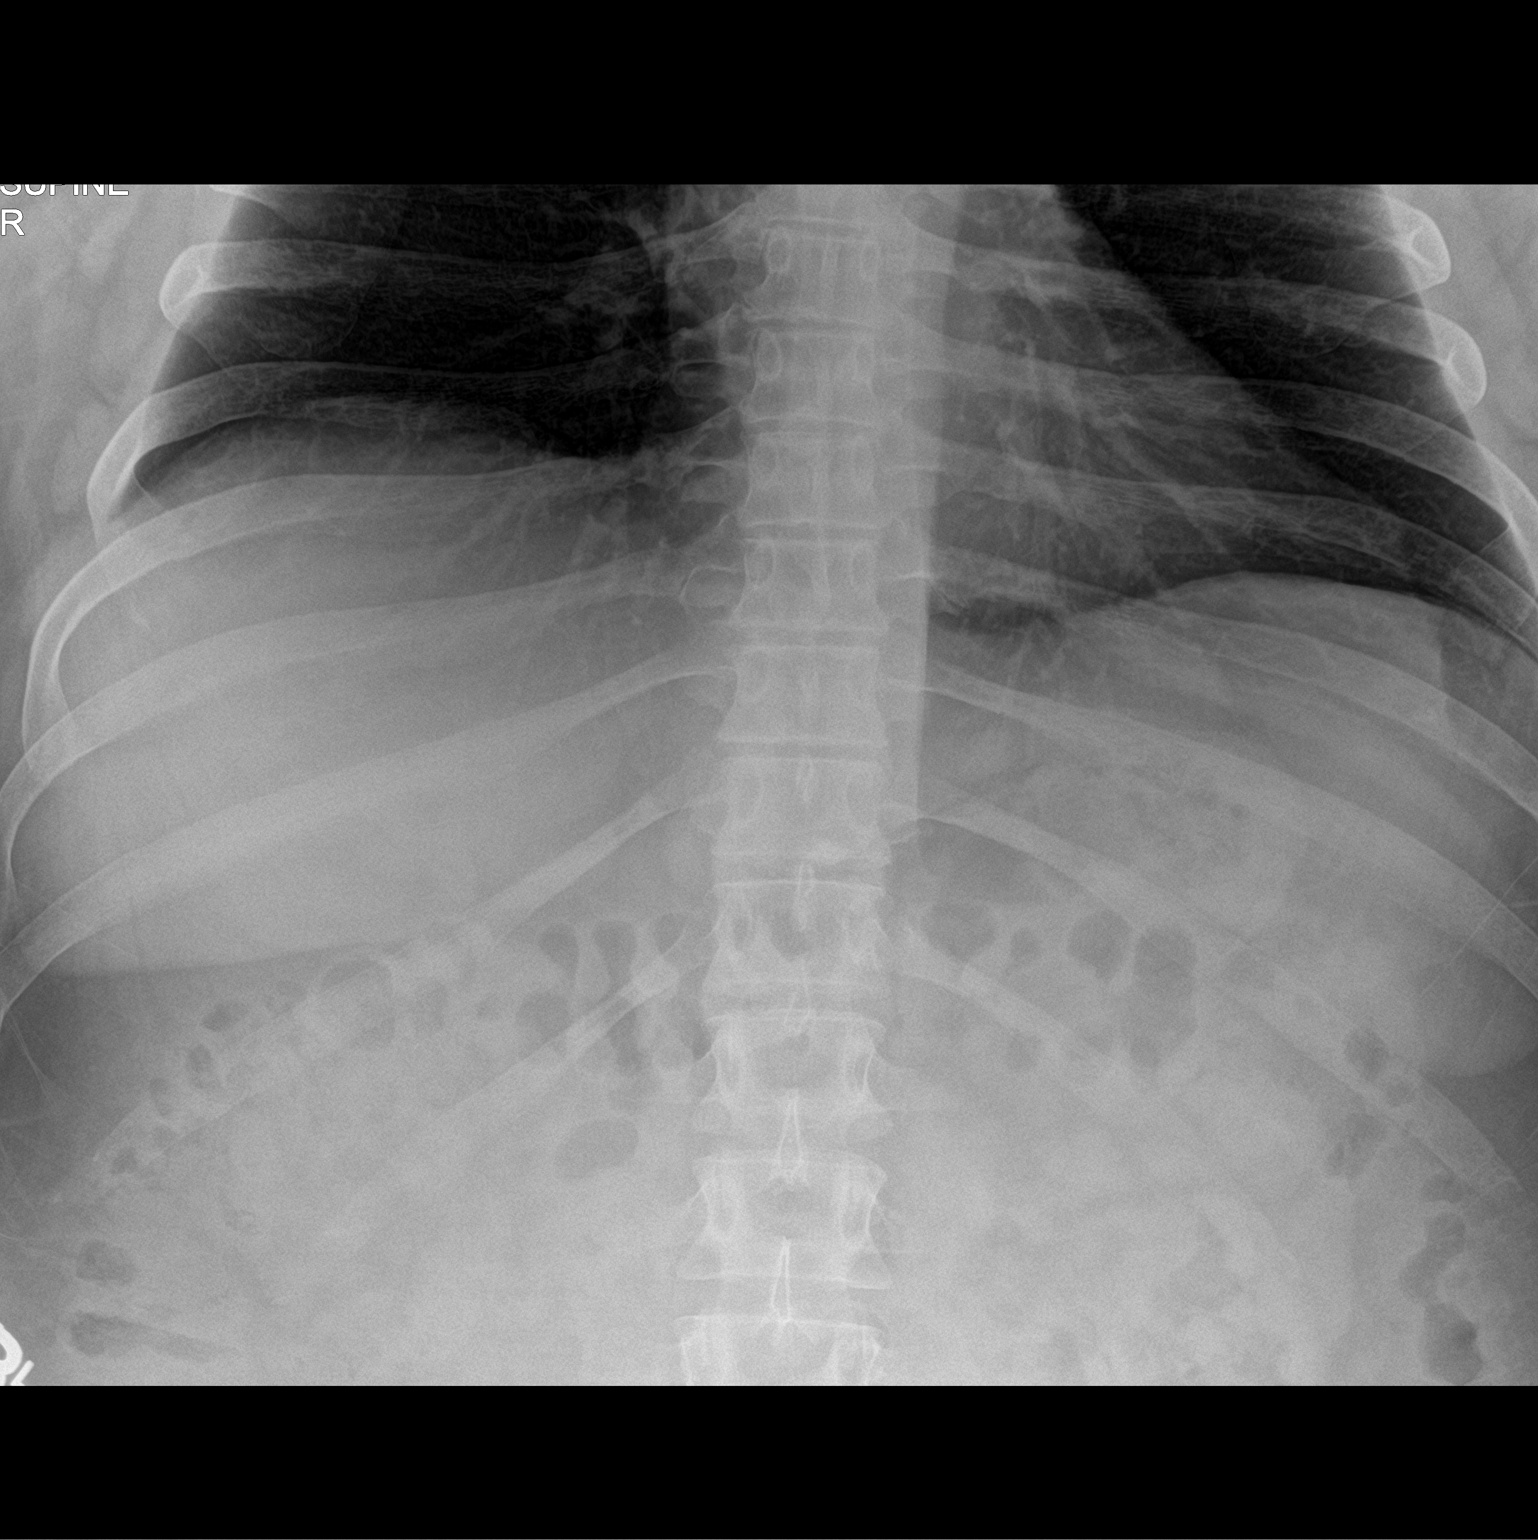
[im 2/2]
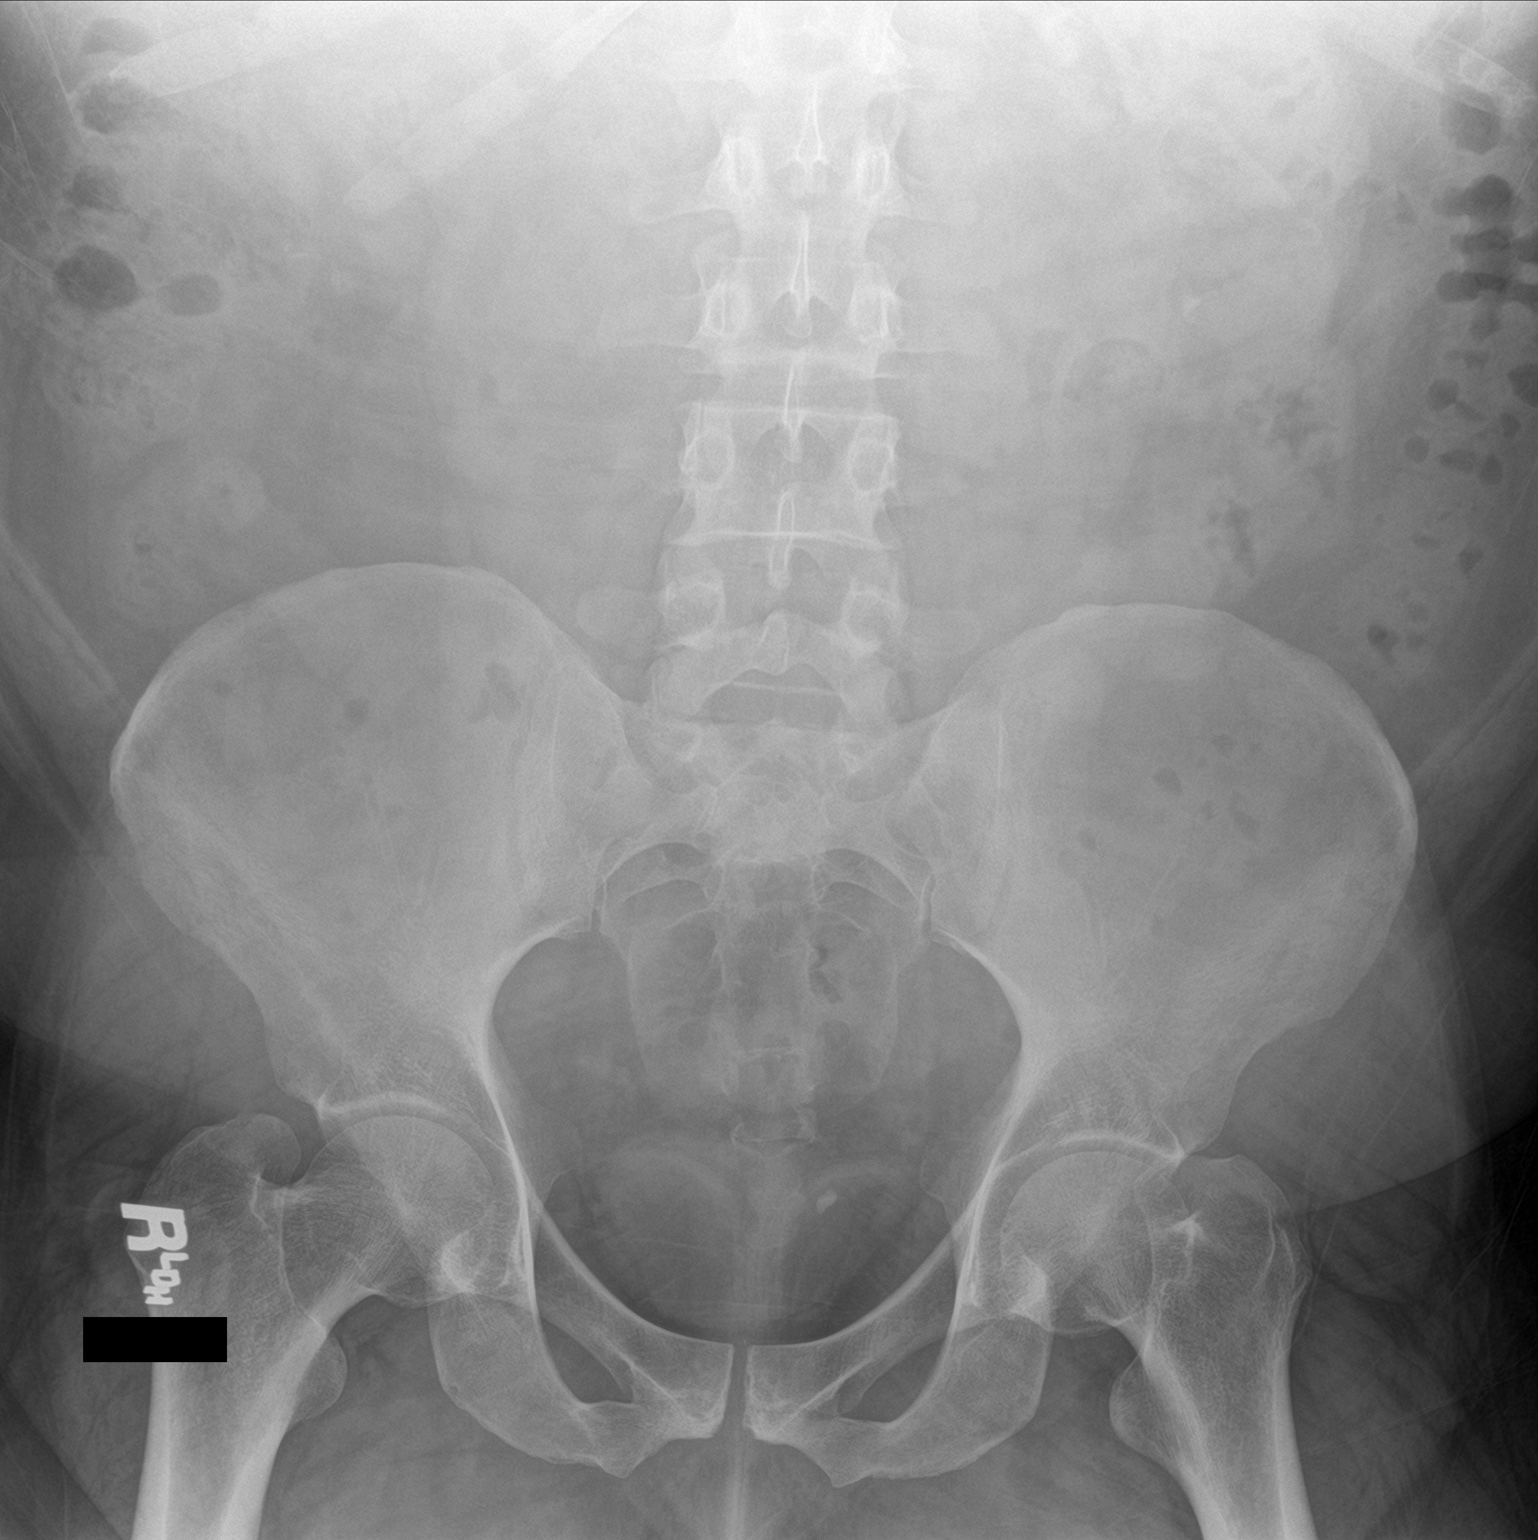

[2 of 2 positions shown; findings below may reference images not displayed]

FINDINGS: Scattered large and small bowel gas is noted. No abnormal mass is
noted. The previously seen proximal left ureteral stone is noted
distally at the UVJ slightly smaller than that seen on the prior
exam. A small lower pole renal stone is noted on the left as well
not as well appreciated on the prior study. This may represent
migration of a fragment of the proximal ureteral stone.
IMPRESSION: Distal left ureteral stone is noted somewhat smaller than that seen
on the prior exam. A lower pole left renal stone is seen which was
not noted on the prior exam and may represent a fragment migrated
into the kidney.

## 2020-06-24 IMAGING — CR DG ABDOMEN 1V
1 series · 2 of 2 positions shown · non-contrast
Comparison: Abdominal radiograph 02/12/2019

CLINICAL DATA: Left side ureteral stone was passed x 7 days ago
Lithotripsy (left)- 01/29/2019

EXAM:
ABDOMEN - 1 VIEW

[Series 1: dg abd 1 view · 0.14mm/px · 2 of 2 slices shown]
[im 1/2]
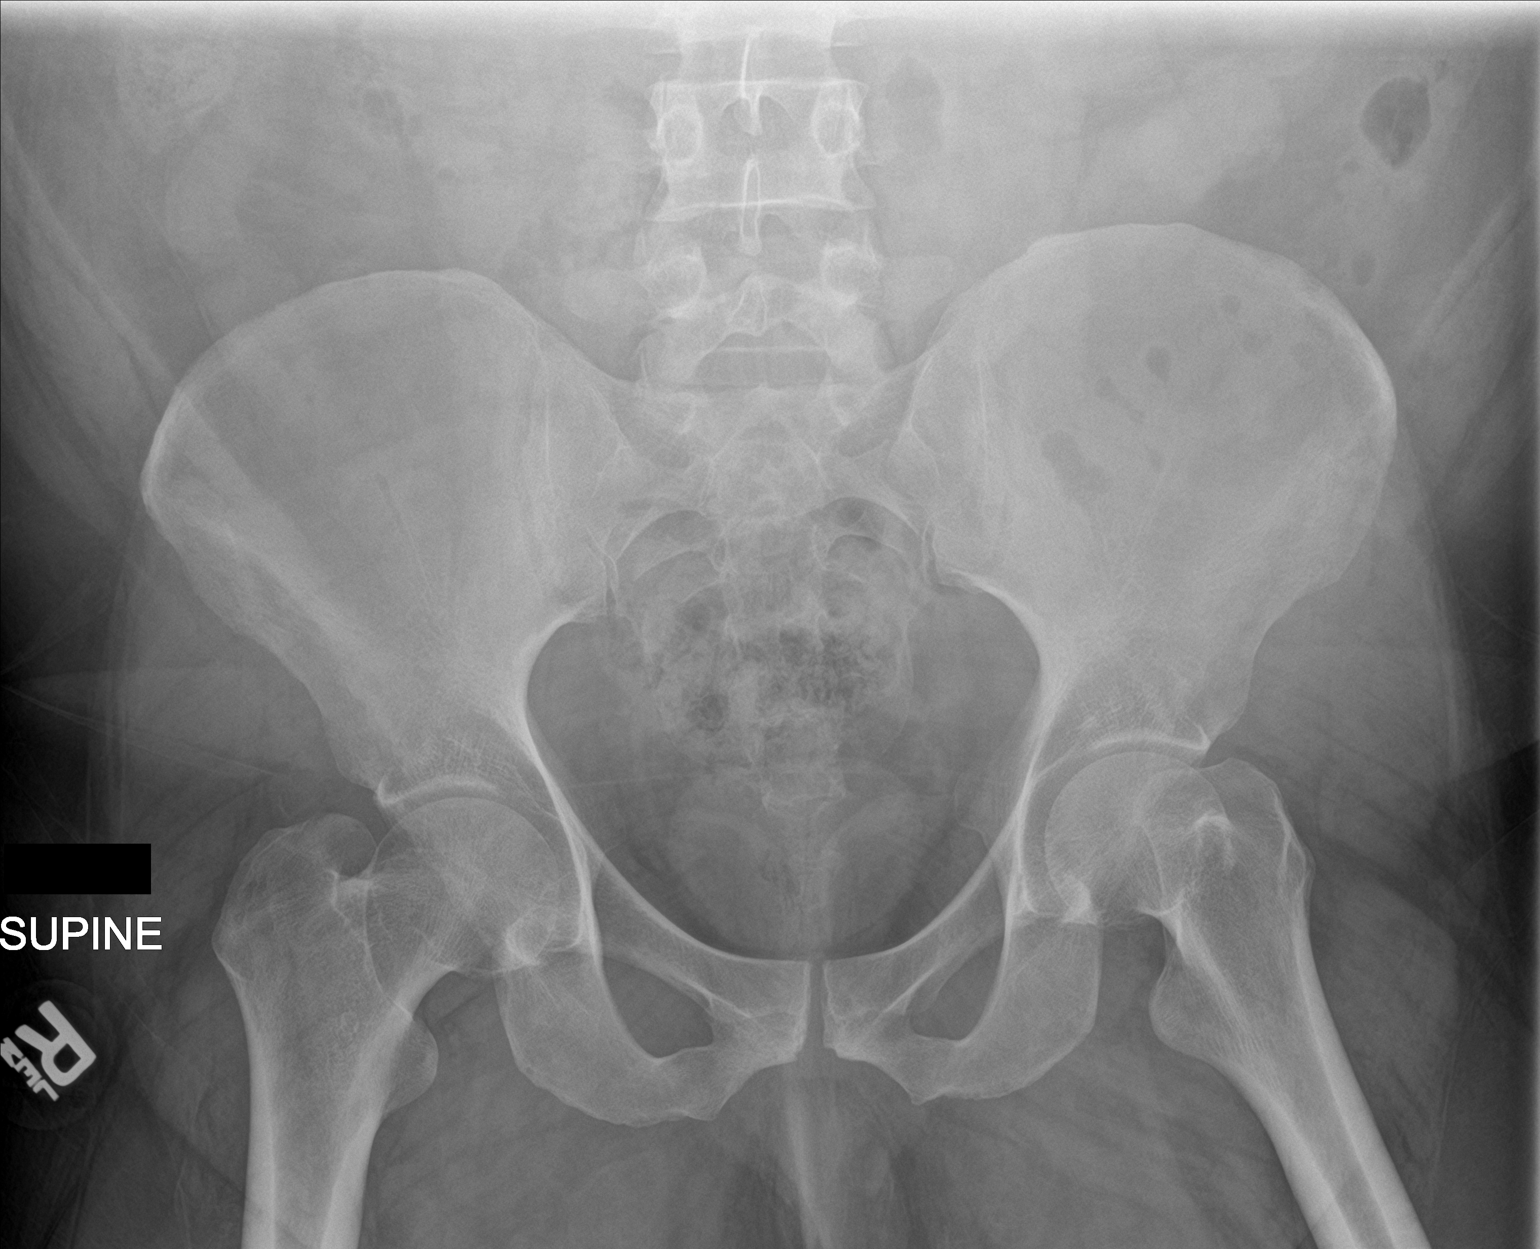
[im 2/2]
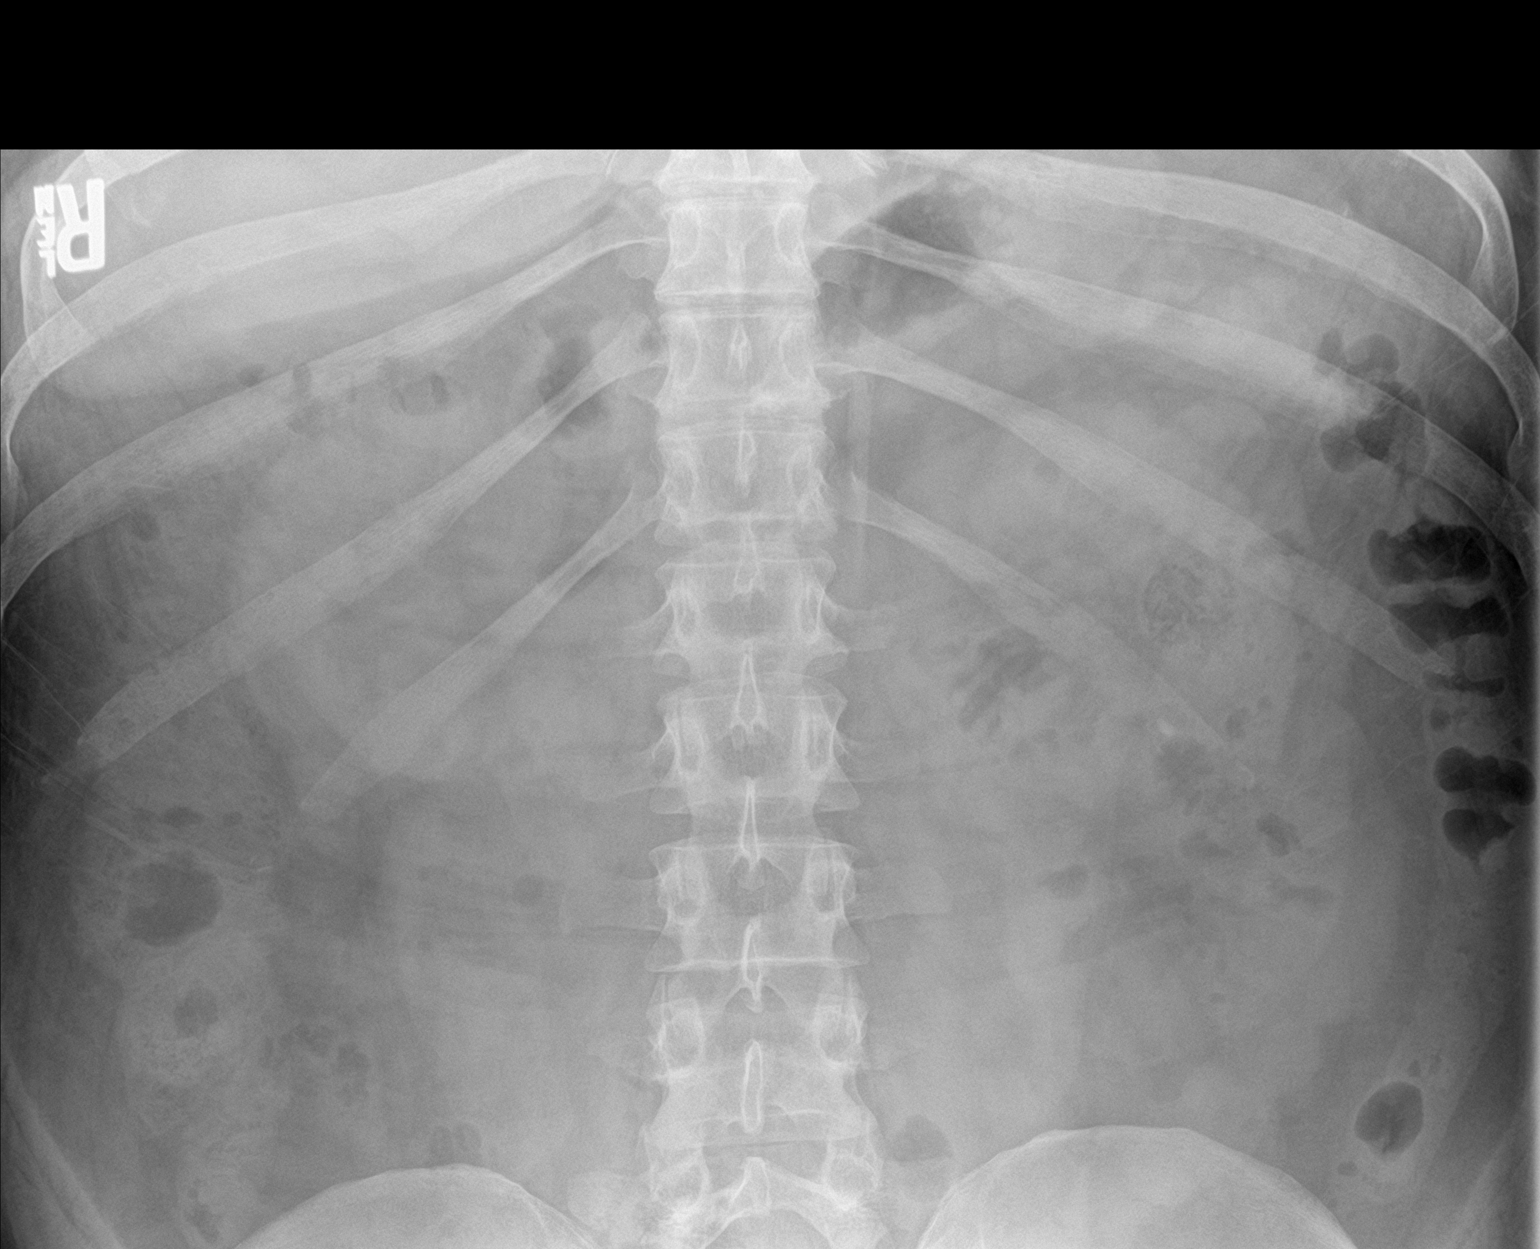

[2 of 2 positions shown; findings below may reference images not displayed]

FINDINGS: The previously seen stone at the left UVJ is no longer present. A 5
mm radiopaque density projects over the inferior pole the left
kidney similar to prior. No right ureteral calculi are identified.

Nonobstructive bowel gas pattern. No evidence of free air. The
visualized skeleton is unremarkable.
IMPRESSION: 1. The previously seen stone at the left UVJ is no longer present,
likely passed.
2. 5 mm probable calculus projects over the inferior pole of the
left kidney, similar to prior.

## 2021-05-31 ENCOUNTER — Encounter: Payer: Self-pay | Admitting: Physician Assistant

## 2021-05-31 ENCOUNTER — Ambulatory Visit: Payer: BC Managed Care – PPO | Admitting: Physician Assistant

## 2022-02-26 ENCOUNTER — Ambulatory Visit (INDEPENDENT_AMBULATORY_CARE_PROVIDER_SITE_OTHER): Payer: BC Managed Care – PPO | Admitting: Nurse Practitioner

## 2022-02-26 ENCOUNTER — Other Ambulatory Visit: Payer: Self-pay

## 2022-02-26 ENCOUNTER — Encounter: Payer: Self-pay | Admitting: Nurse Practitioner

## 2022-02-26 VITALS — BP 122/74 | HR 97 | Temp 98.2°F | Resp 18 | Ht 69.0 in | Wt 266.9 lb

## 2022-02-26 DIAGNOSIS — J4520 Mild intermittent asthma, uncomplicated: Secondary | ICD-10-CM

## 2022-02-26 DIAGNOSIS — Z0000 Encounter for general adult medical examination without abnormal findings: Secondary | ICD-10-CM | POA: Diagnosis not present

## 2022-02-26 DIAGNOSIS — Z1159 Encounter for screening for other viral diseases: Secondary | ICD-10-CM

## 2022-02-26 DIAGNOSIS — Z23 Encounter for immunization: Secondary | ICD-10-CM

## 2022-02-26 DIAGNOSIS — Z7689 Persons encountering health services in other specified circumstances: Secondary | ICD-10-CM

## 2022-02-26 DIAGNOSIS — Z131 Encounter for screening for diabetes mellitus: Secondary | ICD-10-CM

## 2022-02-26 DIAGNOSIS — Z13220 Encounter for screening for lipoid disorders: Secondary | ICD-10-CM

## 2022-02-26 DIAGNOSIS — Z130 Encounter for screening for diseases of the blood and blood-forming organs and certain disorders involving the immune mechanism: Secondary | ICD-10-CM

## 2022-02-26 NOTE — Progress Notes (Signed)
Name: Jesus Nelson   MRN: [id number]    DOB: 09/07/1980   Date:02/26/2022       Progress Note  Subjective  Chief Complaint  Chief Complaint  Patient presents with   Establish Care   Annual Exam    HPI  Patient presents for annual CPE and establish care.  Establish care:  His last physical was unknown.  He has a history of asthma.   Asthma: He says that the spring is worse than the fall.  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 He currently uses albuterol for  his symptoms. Currently uses an albuterol inhaler as needed.   Diet: eats whatever, he eats food that tastes good Exercise: he has a physical job, he says he works 6 days a week and is always on his feet and climbing stairs Sleep: 7 hours, Sunday he gets less Last dental exam:6 months ago Last eye exam: never  Depression: phq 9 is negative    02/26/2022   10:56 AM 05/16/2018   11:28 AM 08/05/2015    2:03 PM  Depression screen PHQ 2/9  Decreased Interest 0 0 0  Down, Depressed, Hopeless 0 0 0  PHQ - 2 Score 0 0 0  Altered sleeping  0   Tired, decreased energy  0   Change in appetite  0   Feeling bad or failure about yourself   0   Trouble concentrating  0   Moving slowly or fidgety/restless  0   Suicidal thoughts  0   PHQ-9 Score  0   Difficult doing work/chores  Not difficult at all     Hypertension:  BP Readings from Last 3 Encounters:  02/26/22 122/74  05/31/20 134/89  04/27/20 126/86    Obesity: Wt Readings from Last 3 Encounters:  02/26/22 266 lb 14.4 oz (121.1 kg)  05/31/20 263 lb (119.3 kg)  04/27/20 246 lb (111.6 kg)   BMI Readings from Last 3 Encounters:  02/26/22 39.41 kg/m  05/31/20 38.84 kg/m  04/27/20 37.40 kg/m     Lipids:  Lab Results  Component Value Date   CHOL 179 08/08/2015   Lab Results  Component Value Date   HDL 44 08/08/2015   Lab Results  Component Value Date   LDLCALC 106 (H) 08/08/2015   Lab Results  Component Value Date   TRIG 144 08/08/2015   No results found for:  "CHOLHDL" No results found for: "LDLDIRECT" Glucose:  Glucose  Date Value Ref Range Status  08/08/2015 85 65 - 99 mg/dL Final    [place] Office Visit from 02/26/2022 in [facility]  AUDIT-C Score 0        Married STD testing and prevention (HIV/chl/gon/syphilis): 08/08/2015 Hep C: ordered  Skin cancer: Discussed monitoring for atypical lesions Colorectal cancer: no concerns, not due Prostate cancer: no concerns, not due No results found for: "PSA"   Lung cancer:   Low Dose CT Chest recommended if Age 37-80 years, 30 pack-year currently smoking OR have quit w/in 15years. Patient does not qualify.   AAA:  The USPSTF recommends one-time screening with ultrasonography in men ages 26 to 69 years who have ever smoked ECG:  none  Vaccines:  HPV: up to at age 78 , ask insurance if age between 62-45  Shingrix: 64-64 yo and ask insurance if covered when patient above 35 yo Pneumonia:  educated and discussed with patient. Flu:  educated and discussed with patient.  Advanced Care Planning: A voluntary discussion about advance care  planning including the explanation and discussion of advance directives.  Discussed health care proxy and Living will, and the patient was able to identify a health care proxy as [place].  Patient does not have a living will at present time. If patient does have living will, I have requested they bring this to the clinic to be scanned in to their chart.  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   Morbid obesity ([place]) 06/04/2015   Smokeless tobacco use 06/04/2015    Past Surgical History:  Procedure Laterality Date   EXTRACORPOREAL SHOCK WAVE LITHOTRIPSY Left 01/29/2019   Procedure: EXTRACORPOREAL SHOCK WAVE LITHOTRIPSY (ESWL);  Surgeon: Hollice Espy, MD;  Location: [facility] ORS;  Service: Urology;  Laterality: Left;   thumb cyst      Family History  Problem Relation Age of Onset   Cancer  Mother        breast   Diabetes Maternal Grandmother    COPD Neg Hx    Heart disease Neg Hx    Hypertension Neg Hx    Stroke Neg Hx    Bladder Cancer Neg Hx    Kidney disease Neg Hx    Prostate cancer Neg Hx    Sickle cell anemia Neg Hx     Social History   Socioeconomic History   Marital status: Married    Spouse name: Not on file   Number of children: 2   Years of education: Not on file   Highest education level: Not on file  Occupational History    Comment: [employer]  Tobacco Use   Smoking status: Former    Years: 3.00    Types: Cigarettes    Quit date: 06/03/1999    Years since quitting: 22.7   Smokeless tobacco: Current    Types: Chew  Vaping Use   Vaping Use: Never used  Substance and Sexual Activity   Alcohol use: Not Currently    Comment: occasional   Drug use: No   Sexual activity: Yes  Other Topics Concern   Not on file  Social History Narrative   Not on file   Social Determinants of Health   Financial Resource Strain: Low Risk  (02/26/2022)   Overall Financial Resource Strain (CARDIA)    Difficulty of Paying Living Expenses: Not hard at all  Food Insecurity: No Food Insecurity (02/26/2022)   Hunger Vital Sign    Worried About Running Out of Food in the Last Year: Never true    Ran Out of Food in the Last Year: Never true  Transportation Needs: No Transportation Needs (02/26/2022)   PRAPARE - Hydrologist (Medical): No    Lack of Transportation (Non-Medical): No  Physical Activity: Sufficiently Active (02/26/2022)   Exercise Vital Sign    Days of Exercise per Week: 6 days    Minutes of Exercise per Session: 150+ min  Stress: Stress Concern Present (02/26/2022)   [place]    Feeling of Stress : To some extent  Social Connections: Socially Integrated (02/26/2022)   Social Connection and Isolation Panel [NHANES]    Frequency of Communication with Friends  and Family: More than three times a week    Frequency of Social Gatherings with Friends and Family: More than three times a week    Attends Religious Services: More than 4 times per year    Active Member of [employer] or Organizations: Yes  Attends Banker Meetings: More than 4 times per year    Marital Status: Married  Catering manager Violence: Not At Risk (02/26/2022)   Humiliation, Afraid, Rape, and Kick questionnaire    Fear of Current or Ex-Partner: No    Emotionally Abused: No    Physically Abused: No    Sexually Abused: No     Current Outpatient Medications:    albuterol (PROVENTIL HFA;VENTOLIN HFA) 108 (90 Base) MCG/ACT inhaler, Inhale 1 puff into the lungs every 4 (four) hours as needed for wheezing or shortness of breath., Disp: 1 Inhaler, Rfl: 1   clobetasol cream (TEMOVATE) 0.05 %, Apply 1 application topically 2 (two) times daily., Disp: 30 g, Rfl: 2   fluticasone (FLONASE) 50 MCG/ACT nasal spray, Place 2 sprays into both nostrils daily. (Patient not taking: Reported on 02/26/2022), Disp: , Rfl:    oxybutynin (DITROPAN XL) 15 MG 24 hr tablet, Take 1 tablet (15 mg total) by mouth daily. (Patient not taking: Reported on 02/26/2022), Disp: 30 tablet, Rfl: 11  No Known Allergies   ROS  Constitutional: Negative for fever or weight change.  Respiratory: Negative for cough and shortness of breath.   Cardiovascular: Negative for chest pain or palpitations.  Gastrointestinal: Negative for abdominal pain, no bowel changes.  Musculoskeletal: Negative for gait problem or joint swelling.  Skin: Negative for rash.  Neurological: Negative for dizziness or headache.  No other specific complaints in a complete review of systems (except as listed in HPI above).    Objective  Vitals:   02/26/22 1053  BP: 122/74  Pulse: 97  Resp: 18  Temp: 98.2 F (36.8 C)  TempSrc: Oral  SpO2: 96%  Weight: 266 lb 14.4 oz (121.1 kg)  Height: 5\' 9"  (1.753 m)    Body mass index is  39.41 kg/m.  Physical Exam  Constitutional: Patient appears well-developed and well-nourished. No distress.  HENT: Head: Normocephalic and atraumatic. Ears: B TMs ok, no erythema or effusion; Nose: Nose normal. Mouth/Throat: Oropharynx is clear and moist. No oropharyngeal exudate.  Eyes: Conjunctivae and EOM are normal. Pupils are equal, round, and reactive to light. No scleral icterus.  Neck: Normal range of motion. Neck supple. No JVD present. No thyromegaly present.  Cardiovascular: Normal rate, regular rhythm and normal heart sounds.  No murmur heard. No BLE edema. Pulmonary/Chest: Effort normal and breath sounds normal. No respiratory distress. Abdominal: Soft. Bowel sounds are normal, no distension. There is no tenderness. no masses Musculoskeletal: Normal range of motion, no joint effusions. No gross deformities Neurological: he is alert and oriented to person, place, and time. No cranial nerve deficit. Coordination, balance, strength, speech and gait are normal.  Skin: Skin is warm and dry. No rash noted. No erythema.  Psychiatric: Patient has a normal mood and affect. behavior is normal. Judgment and thought content normal.   No results found for this or any previous visit (from the past 2160 hour(s)).   Fall Risk:    02/26/2022   10:55 AM 05/16/2018   11:28 AM  Fall Risk   Falls in the past year? 0 0  Number falls in past yr: 0   Injury with Fall? 0   Follow up Falls evaluation completed       Functional Status Survey: Is the patient deaf or have difficulty hearing?: No Does the patient have difficulty seeing, even when wearing glasses/contacts?: No Does the patient have difficulty concentrating, remembering, or making decisions?: No Does the patient have difficulty walking or climbing stairs?:  No Does the patient have difficulty dressing or bathing?: No Does the patient have difficulty doing errands alone such as visiting a doctor's office or shopping?:  No    Assessment & Plan  1. Annual physical exam  - Lipid panel - CBC with Differential/Platelet - COMPLETE METABOLIC PANEL WITH GFR - Hemoglobin A1c - Hepatitis C antibody  2. Morbid obesity ([place]) -increase physical activity as tolerated -eat well balanced diet with portion control - Lipid panel - CBC with Differential/Platelet - COMPLETE METABOLIC PANEL WITH GFR - Hemoglobin A1c  3. Mild intermittent asthma without complication stable  4. Need for influenza vaccination  - Flu Vaccine QUAD 6+ mos PF IM (Fluarix Quad PF)  5. Screening for deficiency anemia  - CBC with Differential/Platelet  6. Screening for diabetes mellitus  - COMPLETE METABOLIC PANEL WITH GFR - Hemoglobin A1c  7. Screening for cholesterol level  - Lipid panel  8. Encounter for hepatitis C screening test for low risk patient  - Hepatitis C antibody   9. Encounter to establish care   -Prostate cancer screening and PSA options (with potential risks and benefits of testing vs not testing) were discussed along with recent recs/guidelines. -USPSTF grade A and B recommendations reviewed with patient; age-appropriate recommendations, preventive care, screening tests, etc discussed and encouraged; healthy living encouraged; see AVS for patient education given to patient -Discussed importance of 150 minutes of physical activity weekly, eat two servings of fish weekly, eat one serving of tree nuts ( cashews, pistachios, pecans, almonds.Marland Kitchen) every other day, eat 6 servings of fruit/vegetables daily and drink plenty of water and avoid sweet beverages.

## 2022-02-27 LAB — CBC WITH DIFFERENTIAL/PLATELET
Absolute Monocytes: 525 cells/uL (ref 200–950)
Basophils Absolute: 41 cells/uL (ref 0–200)
Basophils Relative: 0.7 %
Eosinophils Absolute: 77 cells/uL (ref 15–500)
Eosinophils Relative: 1.3 %
HCT: 46.2 % (ref 38.5–50.0)
Hemoglobin: 15.3 g/dL (ref 13.2–17.1)
Lymphs Abs: 1540 cells/uL (ref 850–3900)
MCH: 29.7 pg (ref 27.0–33.0)
MCHC: 33.1 g/dL (ref 32.0–36.0)
MCV: 89.7 fL (ref 80.0–100.0)
MPV: 11.3 fL (ref 7.5–12.5)
Monocytes Relative: 8.9 %
Neutro Abs: 3717 cells/uL (ref 1500–7800)
Neutrophils Relative %: 63 %
Platelets: 233 10*3/uL (ref 140–400)
RBC: 5.15 10*6/uL (ref 4.20–5.80)
RDW: 12.3 % (ref 11.0–15.0)
Total Lymphocyte: 26.1 %
WBC: 5.9 10*3/uL (ref 3.8–10.8)

## 2022-02-27 LAB — COMPLETE METABOLIC PANEL WITH GFR
AG Ratio: 1.7 (calc) (ref 1.0–2.5)
ALT: 27 U/L (ref 9–46)
AST: 16 U/L (ref 10–40)
Albumin: 4.3 g/dL (ref 3.6–5.1)
Alkaline phosphatase (APISO): 48 U/L (ref 36–130)
BUN: 12 mg/dL (ref 7–25)
CO2: 27 mmol/L (ref 20–32)
Calcium: 8.9 mg/dL (ref 8.6–10.3)
Chloride: 102 mmol/L (ref 98–110)
Creat: 1.12 mg/dL (ref 0.60–1.29)
Globulin: 2.6 g/dL (calc) (ref 1.9–3.7)
Glucose, Bld: 86 mg/dL (ref 65–99)
Potassium: 4.5 mmol/L (ref 3.5–5.3)
Sodium: 139 mmol/L (ref 135–146)
Total Bilirubin: 0.5 mg/dL (ref 0.2–1.2)
Total Protein: 6.9 g/dL (ref 6.1–8.1)
eGFR: 84 mL/min/{1.73_m2} (ref >=60)

## 2022-02-27 LAB — LIPID PANEL
Cholesterol: 176 mg/dL (ref <200)
HDL: 42 mg/dL (ref >=40)
LDL Cholesterol (Calc): 94 mg/dL (calc)
Non-HDL Cholesterol (Calc): 134 mg/dL (calc) — ABNORMAL HIGH (ref <130)
Total CHOL/HDL Ratio: 4.2 (calc) (ref <5.0)
Triglycerides: 282 mg/dL — ABNORMAL HIGH (ref <150)

## 2022-02-27 LAB — HEMOGLOBIN A1C
Hgb A1c MFr Bld: 5.4 % of total Hgb (ref <5.7)
Mean Plasma Glucose: 108 mg/dL
eAG (mmol/L): 6 mmol/L

## 2022-02-27 LAB — HEPATITIS C ANTIBODY: Hepatitis C Ab: NONREACTIVE

## 2022-03-13 ENCOUNTER — Ambulatory Visit: Payer: BC Managed Care – PPO | Admitting: Nurse Practitioner

## 2022-07-29 ENCOUNTER — Encounter: Payer: Self-pay | Admitting: Nurse Practitioner

## 2022-07-30 ENCOUNTER — Other Ambulatory Visit: Payer: Self-pay | Admitting: Nurse Practitioner

## 2022-07-30 DIAGNOSIS — J4520 Mild intermittent asthma, uncomplicated: Secondary | ICD-10-CM

## 2022-07-30 MED ORDER — ALBUTEROL SULFATE HFA 108 (90 BASE) MCG/ACT IN AERS: 1.0000 | INHALATION_SPRAY | RESPIRATORY_TRACT | 5 refills | Status: DC | PRN

## 2023-03-01 ENCOUNTER — Other Ambulatory Visit: Payer: Self-pay

## 2023-03-01 ENCOUNTER — Encounter: Payer: Self-pay | Admitting: Nurse Practitioner

## 2023-03-01 ENCOUNTER — Ambulatory Visit (INDEPENDENT_AMBULATORY_CARE_PROVIDER_SITE_OTHER): Payer: BC Managed Care – PPO | Admitting: Nurse Practitioner

## 2023-03-01 VITALS — BP 118/82 | HR 88 | Temp 98.0°F | Resp 16 | Ht 69.0 in | Wt 260.0 lb

## 2023-03-01 DIAGNOSIS — Z0001 Encounter for general adult medical examination with abnormal findings: Secondary | ICD-10-CM

## 2023-03-01 DIAGNOSIS — E781 Pure hyperglyceridemia: Secondary | ICD-10-CM | POA: Diagnosis not present

## 2023-03-01 DIAGNOSIS — Z0000 Encounter for general adult medical examination without abnormal findings: Secondary | ICD-10-CM

## 2023-03-01 DIAGNOSIS — Z131 Encounter for screening for diabetes mellitus: Secondary | ICD-10-CM

## 2023-03-01 DIAGNOSIS — Z23 Encounter for immunization: Secondary | ICD-10-CM | POA: Diagnosis not present

## 2023-03-01 DIAGNOSIS — Z13220 Encounter for screening for lipoid disorders: Secondary | ICD-10-CM

## 2023-03-01 DIAGNOSIS — Z130 Encounter for screening for diseases of the blood and blood-forming organs and certain disorders involving the immune mechanism: Secondary | ICD-10-CM

## 2023-03-02 LAB — CBC WITH DIFFERENTIAL/PLATELET
Absolute Monocytes: 583 {cells}/uL (ref 200–950)
Basophils Absolute: 32 {cells}/uL (ref 0–200)
Basophils Relative: 0.6 %
Eosinophils Absolute: 119 {cells}/uL (ref 15–500)
Eosinophils Relative: 2.2 %
HCT: 46.9 % (ref 38.5–50.0)
Hemoglobin: 15.3 g/dL (ref 13.2–17.1)
Lymphs Abs: 1285 {cells}/uL (ref 850–3900)
MCH: 29.6 pg (ref 27.0–33.0)
MCHC: 32.6 g/dL (ref 32.0–36.0)
MCV: 90.7 fL (ref 80.0–100.0)
MPV: 11.6 fL (ref 7.5–12.5)
Monocytes Relative: 10.8 %
Neutro Abs: 3380 {cells}/uL (ref 1500–7800)
Neutrophils Relative %: 62.6 %
Platelets: 219 10*3/uL (ref 140–400)
RBC: 5.17 10*6/uL (ref 4.20–5.80)
RDW: 12 % (ref 11.0–15.0)
Total Lymphocyte: 23.8 %
WBC: 5.4 10*3/uL (ref 3.8–10.8)

## 2023-03-02 LAB — LIPID PANEL
Cholesterol: 192 mg/dL (ref <200)
HDL: 44 mg/dL (ref >=40)
LDL Cholesterol (Calc): 120 mg/dL — ABNORMAL HIGH
Non-HDL Cholesterol (Calc): 148 mg/dL — ABNORMAL HIGH (ref <130)
Total CHOL/HDL Ratio: 4.4 (calc) (ref <5.0)
Triglycerides: 168 mg/dL — ABNORMAL HIGH (ref <150)

## 2023-03-02 LAB — COMPLETE METABOLIC PANEL WITH GFR
AG Ratio: 1.8 (calc) (ref 1.0–2.5)
ALT: 31 U/L (ref 9–46)
AST: 18 U/L (ref 10–40)
Albumin: 4.4 g/dL (ref 3.6–5.1)
Alkaline phosphatase (APISO): 47 U/L (ref 36–130)
BUN: 20 mg/dL (ref 7–25)
CO2: 25 mmol/L (ref 20–32)
Calcium: 9.2 mg/dL (ref 8.6–10.3)
Chloride: 102 mmol/L (ref 98–110)
Creat: 1.13 mg/dL (ref 0.60–1.29)
Globulin: 2.5 g/dL (ref 1.9–3.7)
Glucose, Bld: 82 mg/dL (ref 65–99)
Potassium: 5.1 mmol/L (ref 3.5–5.3)
Sodium: 136 mmol/L (ref 135–146)
Total Bilirubin: 0.5 mg/dL (ref 0.2–1.2)
Total Protein: 6.9 g/dL (ref 6.1–8.1)
eGFR: 83 mL/min/{1.73_m2} (ref >=60)

## 2023-03-02 LAB — HEMOGLOBIN A1C
Hgb A1c MFr Bld: 5.6 %{Hb} (ref <5.7)
Mean Plasma Glucose: 114 mg/dL
eAG (mmol/L): 6.3 mmol/L

## 2023-06-27 NOTE — Progress Notes (Signed)
BP (!) 132/90   Pulse 94   Temp 97.8 F (36.6 C)   Resp 18   Ht 5\' 9"  (1.753 m)   Wt 272 lb 9.6 oz (123.7 kg)   SpO2 96%   BMI 40.26 kg/m    Subjective:    Patient ID: Jesus Nelson, male    DOB: [birth year], 43 y.o.   MRN: [id number]  HPI: Jesus Nelson is a 43 y.o. male  Chief Complaint  Patient presents with   Fatigue    Low energy, weight gain, low libido    Discussed the use of AI scribe software for clinical note transcription with the patient, who gave verbal consent to proceed.  History of Present Illness   The patient, a 43 year old male, presents with long-standing fatigue, recent weight gain, and sexual dysfunction. The patient's concerns were triggered by the recent death of a peer, which served as an 'eye opener.' He reports feeling fatigued for the past 25 years, which he attributes to long work hours. He also reports a lack of energy and difficulty maintaining an erection, which has been noticed by his partner. The patient has been taking 5mg  of generic Cialis for the past four months, prescribed by an online service, but reports no significant improvement. He denies any urinary symptoms. The patient also reports snoring when extremely tired, but has never been tested for sleep apnea. He expresses some reluctance towards medical intervention, stating he 'doesn't like doctors' and has 'never been a really sick person.'       06/28/2023   10:24 AM 03/01/2023    9:29 AM 03/01/2023    9:24 AM  Depression screen PHQ 2/9  Decreased Interest 0 0 0  Down, Depressed, Hopeless 0 0 0  PHQ - 2 Score 0 0 0    Relevant past medical, surgical, family and social history reviewed and updated as indicated. Interim medical history since our last visit reviewed. Allergies and medications reviewed and updated.  Review of Systems  Constitutional: Negative for fever, positive for  weight change.  Respiratory: Negative for cough and shortness of breath.   Cardiovascular: Negative for  chest pain or palpitations.  Gastrointestinal: Negative for abdominal pain, no bowel changes.  Musculoskeletal: Negative for gait problem or joint swelling.  Skin: Negative for rash.  Neurological: Negative for dizziness or headache.  No other specific complaints in a complete review of systems (except as listed in HPI above).      Objective:    BP (!) 132/90   Pulse 94   Temp 97.8 F (36.6 C)   Resp 18   Ht 5\' 9"  (1.753 m)   Wt 272 lb 9.6 oz (123.7 kg)   SpO2 96%   BMI 40.26 kg/m    Wt Readings from Last 3 Encounters:  06/28/23 272 lb 9.6 oz (123.7 kg)  03/01/23 260 lb (117.9 kg)  02/26/22 266 lb 14.4 oz (121.1 kg)    Physical Exam Vitals reviewed.  Constitutional:      Appearance: Normal appearance.  HENT:     Head: Normocephalic.  Cardiovascular:     Rate and Rhythm: Normal rate and regular rhythm.  Pulmonary:     Effort: Pulmonary effort is normal.     Breath sounds: Normal breath sounds.  Musculoskeletal:        General: Normal range of motion.  Skin:    General: Skin is warm and dry.  Neurological:     General: No focal deficit present.  Mental Status: He is alert and oriented to person, place, and time. Mental status is at baseline.  Psychiatric:        Mood and Affect: Mood normal.        Behavior: Behavior normal.        Thought Content: Thought content normal.        Judgment: Judgment normal.    EKG: normal sinus rhythm.  Results for orders placed or performed in visit on 03/01/23  CBC with Differential/Platelet   Collection Time: 03/01/23  9:53 AM  Result Value Ref Range   WBC 5.4 3.8 - 10.8 Thousand/uL   RBC 5.17 4.20 - 5.80 Million/uL   Hemoglobin 15.3 13.2 - 17.1 g/dL   HCT 84.1 32.4 - 40.1 %   MCV 90.7 80.0 - 100.0 fL   MCH 29.6 27.0 - 33.0 pg   MCHC 32.6 32.0 - 36.0 g/dL   RDW 02.7 25.3 - 66.4 %   Platelets 219 140 - 400 Thousand/uL   MPV 11.6 7.5 - 12.5 fL   Neutro Abs 3,380 1,500 - 7,800 cells/uL   Lymphs Abs 1,285 850 - 3,900  cells/uL   Absolute Monocytes 583 200 - 950 cells/uL   Eosinophils Absolute 119 15 - 500 cells/uL   Basophils Absolute 32 0 - 200 cells/uL   Neutrophils Relative % 62.6 %   Total Lymphocyte 23.8 %   Monocytes Relative 10.8 %   Eosinophils Relative 2.2 %   Basophils Relative 0.6 %  COMPLETE METABOLIC PANEL WITH GFR   Collection Time: 03/01/23  9:53 AM  Result Value Ref Range   Glucose, Bld 82 65 - 99 mg/dL   BUN 20 7 - 25 mg/dL   Creat 4.03 4.74 - 2.59 mg/dL   eGFR 83 > OR = 60 DG/LOV/5.64P3   BUN/Creatinine Ratio SEE NOTE: 6 - 22 (calc)   Sodium 136 135 - 146 mmol/L   Potassium 5.1 3.5 - 5.3 mmol/L   Chloride 102 98 - 110 mmol/L   CO2 25 20 - 32 mmol/L   Calcium 9.2 8.6 - 10.3 mg/dL   Total Protein 6.9 6.1 - 8.1 g/dL   Albumin 4.4 3.6 - 5.1 g/dL   Globulin 2.5 1.9 - 3.7 g/dL (calc)   AG Ratio 1.8 1.0 - 2.5 (calc)   Total Bilirubin 0.5 0.2 - 1.2 mg/dL   Alkaline phosphatase (APISO) 47 36 - 130 U/L   AST 18 10 - 40 U/L   ALT 31 9 - 46 U/L  Lipid panel   Collection Time: 03/01/23  9:53 AM  Result Value Ref Range   Cholesterol 192 <200 mg/dL   HDL 44 > OR = 40 mg/dL   Triglycerides 295 (H) <150 mg/dL   LDL Cholesterol (Calc) 120 (H) mg/dL (calc)   Total CHOL/HDL Ratio 4.4 <5.0 (calc)   Non-HDL Cholesterol (Calc) 148 (H) <130 mg/dL (calc)  Hemoglobin J8A   Collection Time: 03/01/23  9:53 AM  Result Value Ref Range   Hgb A1c MFr Bld 5.6 <5.7 % of total Hgb   Mean Plasma Glucose 114 mg/dL   eAG (mmol/L) 6.3 mmol/L       Assessment & Plan:   Problem List Items Addressed This Visit   None Visit Diagnoses       Other fatigue    -  Primary   Relevant Orders   CBC with Differential/Platelet   COMPLETE METABOLIC PANEL WITH GFR   Hemoglobin A1c   TSH   VITAMIN D 25 Hydroxy (Vit-D  Deficiency, Fractures)   Vitamin B12   Testosterone   Estrogens, total     Snoring       Relevant Orders   Ambulatory referral to Pulmonology     Erectile dysfunction, unspecified  erectile dysfunction type       Relevant Orders   EKG 12-Lead        Assessment and Plan    Erectile Dysfunction Reports decreased firmness and efficacy of erections. Currently on Tadalafil 5mg  with minimal improvement. Normal EKG. -Increase Tadalafil to 10mg  as needed. -If no improvement, consider switching to Sildenafil.  Fatigue/Weight Gain Chronic fatigue and recent weight gain. Concerns about possible hormonal imbalance. -Order fasting labs including testosterone and estrogen levels. Patient to return for labs on a weekday morning.  Possible Sleep Apnea Reports snoring when extremely tired. Increased risk due to weight gain. -Refer to pulmonology for sleep study.  Follow-up Await lab results and sleep study findings. Patient to report back on efficacy of increased Tadalafil dose.        Follow up plan: Return if symptoms worsen or fail to improve.

## 2023-06-28 ENCOUNTER — Encounter: Payer: Self-pay | Admitting: Nurse Practitioner

## 2023-06-28 ENCOUNTER — Ambulatory Visit: Payer: BC Managed Care – PPO | Admitting: Nurse Practitioner

## 2023-06-28 VITALS — BP 132/90 | HR 94 | Temp 97.8°F | Resp 18 | Ht 69.0 in | Wt 272.6 lb

## 2023-06-28 DIAGNOSIS — R0683 Snoring: Secondary | ICD-10-CM

## 2023-06-28 DIAGNOSIS — N529 Male erectile dysfunction, unspecified: Secondary | ICD-10-CM | POA: Diagnosis not present

## 2023-06-28 DIAGNOSIS — R5383 Other fatigue: Secondary | ICD-10-CM | POA: Diagnosis not present

## 2023-07-02 ENCOUNTER — Encounter: Payer: Self-pay | Admitting: Nurse Practitioner

## 2023-07-04 LAB — HEMOGLOBIN A1C
Hgb A1c MFr Bld: 5.6 %{Hb} (ref <5.7)
Mean Plasma Glucose: 114 mg/dL
eAG (mmol/L): 6.3 mmol/L

## 2023-07-04 LAB — CBC WITH DIFFERENTIAL/PLATELET
Absolute Lymphocytes: 1285 {cells}/uL (ref 850–3900)
Absolute Monocytes: 458 {cells}/uL (ref 200–950)
Basophils Absolute: 31 {cells}/uL (ref 0–200)
Basophils Relative: 0.7 %
Eosinophils Absolute: 62 {cells}/uL (ref 15–500)
Eosinophils Relative: 1.4 %
HCT: 45.8 % (ref 38.5–50.0)
Hemoglobin: 14.9 g/dL (ref 13.2–17.1)
MCH: 29.4 pg (ref 27.0–33.0)
MCHC: 32.5 g/dL (ref 32.0–36.0)
MCV: 90.5 fL (ref 80.0–100.0)
MPV: 11.8 fL (ref 7.5–12.5)
Monocytes Relative: 10.4 %
Neutro Abs: 2565 {cells}/uL (ref 1500–7800)
Neutrophils Relative %: 58.3 %
Platelets: 164 10*3/uL (ref 140–400)
RBC: 5.06 10*6/uL (ref 4.20–5.80)
RDW: 12.3 % (ref 11.0–15.0)
Total Lymphocyte: 29.2 %
WBC: 4.4 10*3/uL (ref 3.8–10.8)

## 2023-07-04 LAB — VITAMIN B12: Vitamin B-12: 227 pg/mL (ref 200–1100)

## 2023-07-04 LAB — COMPLETE METABOLIC PANEL WITH GFR
AG Ratio: 2 (calc) (ref 1.0–2.5)
ALT: 36 U/L (ref 9–46)
AST: 18 U/L (ref 10–40)
Albumin: 4.3 g/dL (ref 3.6–5.1)
Alkaline phosphatase (APISO): 46 U/L (ref 36–130)
BUN/Creatinine Ratio: 11 (calc) (ref 6–22)
BUN: 14 mg/dL (ref 7–25)
CO2: 27 mmol/L (ref 20–32)
Calcium: 8.9 mg/dL (ref 8.6–10.3)
Chloride: 104 mmol/L (ref 98–110)
Creat: 1.31 mg/dL — ABNORMAL HIGH (ref 0.60–1.29)
Globulin: 2.1 g/dL (ref 1.9–3.7)
Glucose, Bld: 89 mg/dL (ref 65–99)
Potassium: 4.7 mmol/L (ref 3.5–5.3)
Sodium: 140 mmol/L (ref 135–146)
Total Bilirubin: 0.5 mg/dL (ref 0.2–1.2)
Total Protein: 6.4 g/dL (ref 6.1–8.1)
eGFR: 69 mL/min/{1.73_m2} (ref >=60)

## 2023-07-04 LAB — TSH: TSH: 1.72 m[IU]/L (ref 0.40–4.50)

## 2023-07-04 LAB — ESTROGENS, TOTAL: Estrogen: 105 pg/mL (ref <=404)

## 2023-07-04 LAB — VITAMIN D 25 HYDROXY (VIT D DEFICIENCY, FRACTURES): Vit D, 25-Hydroxy: 21 ng/mL — ABNORMAL LOW (ref 30–100)

## 2023-07-04 LAB — TESTOSTERONE: Testosterone: 299 ng/dL (ref 250–827)

## 2023-07-07 ENCOUNTER — Other Ambulatory Visit: Payer: Self-pay | Admitting: Nurse Practitioner

## 2023-07-07 DIAGNOSIS — J4520 Mild intermittent asthma, uncomplicated: Secondary | ICD-10-CM

## 2023-07-09 NOTE — Telephone Encounter (Signed)
 Requested Prescriptions  Pending Prescriptions Disp Refills   albuterol  (VENTOLIN  HFA) 108 (90 Base) MCG/ACT inhaler [Pharmacy Med Name: ALBUTEROL  HFA INH (200 PUFFS) 8.5GM] 8.5 g 2    Sig: INHALE 1 PUFF INTO THE LUNGS EVERY 4 HOURS AS NEEDED FOR WHEEZING OR SHORTNESS OF BREATH     Pulmonology:  Beta Agonists 2 Failed - 07/09/2023  9:07 AM      Failed - Last BP in normal range    BP Readings from Last 1 Encounters:  06/28/23 (!) 132/90         Passed - Last Heart Rate in normal range    Pulse Readings from Last 1 Encounters:  06/28/23 94         Passed - Valid encounter within last 12 months    Recent Outpatient Visits           1 week ago Other fatigue   [facility] Gareth Mliss FALCON, FNP   4 months ago Annual physical exam   [facility] Gareth Mliss FALCON, FNP   1 year ago Annual physical exam   [facility] Gareth Mliss FALCON, FNP   5 years ago Inflamed external hemorrhoid   [facility] Lada, Newell SQUIBB, MD   7 years ago Preventative health care   [facility] Lada, Newell SQUIBB, MD       Future Appointments             In 8 months Gareth, Mliss FALCON, FNP [facility], [facility]

## 2023-07-15 ENCOUNTER — Institutional Professional Consult (permissible substitution): Payer: BC Managed Care – PPO | Admitting: Sleep Medicine

## 2023-07-24 ENCOUNTER — Other Ambulatory Visit: Payer: Self-pay | Admitting: Nurse Practitioner

## 2023-07-24 DIAGNOSIS — N529 Male erectile dysfunction, unspecified: Secondary | ICD-10-CM

## 2023-07-24 MED ORDER — TADALAFIL 10 MG PO TABS: 10.0000 mg | ORAL_TABLET | ORAL | 1 refills | Status: AC | PRN

## 2024-03-06 ENCOUNTER — Encounter: Payer: Self-pay | Admitting: Nurse Practitioner

## 2024-03-06 ENCOUNTER — Ambulatory Visit: Payer: Self-pay | Admitting: Nurse Practitioner

## 2024-03-06 VITALS — BP 132/78 | HR 90 | Temp 98.4°F | Resp 18 | Ht 69.0 in | Wt 270.1 lb

## 2024-03-06 DIAGNOSIS — Z131 Encounter for screening for diabetes mellitus: Secondary | ICD-10-CM

## 2024-03-06 DIAGNOSIS — Z23 Encounter for immunization: Secondary | ICD-10-CM | POA: Diagnosis not present

## 2024-03-06 DIAGNOSIS — Z0000 Encounter for general adult medical examination without abnormal findings: Secondary | ICD-10-CM | POA: Diagnosis not present

## 2024-03-06 DIAGNOSIS — Z13220 Encounter for screening for lipoid disorders: Secondary | ICD-10-CM

## 2024-03-06 DIAGNOSIS — Z125 Encounter for screening for malignant neoplasm of prostate: Secondary | ICD-10-CM

## 2024-03-06 DIAGNOSIS — Z130 Encounter for screening for diseases of the blood and blood-forming organs and certain disorders involving the immune mechanism: Secondary | ICD-10-CM

## 2024-03-06 NOTE — Progress Notes (Signed)
 Name: Jesus Nelson   MRN: [id number]    DOB: Nov 13, 1980   Date:03/06/2024       Progress Note  Subjective  Chief Complaint  No chief complaint on file.   HPI  Patient presents for annual CPE. Discussed the use of AI scribe software for clinical note transcription with the patient, who gave verbal consent to proceed.  History of Present Illness Jesus Nelson is a 43 year old male who presents for a routine follow-up visit.  Nocturia and sleep disturbance - Nocturia, waking to urinate four times nightly, ongoing for a prolonged period - Previously evaluated by urologist; cystoscopy performed, diagnosed with overactive bladder - Prescribed medication for overactive bladder, discontinued due to morning grogginess - Sleep interrupted by nocturia, typically sleeps from 9:30 PM to 4:00 AM  Renal calculi - History of kidney stones, with three or four episodes in the past - One episode required lithotripsy - No episodes for several years  Ocular symptoms - Crusty eyes upon waking, more pronounced in summer with increased sweating - Eye doctor attributed symptoms to dirty eyelids; recommended Biotru wipes - Vision assessed as good at last eye exam  Fatigue - Fatigue attributed to physically demanding work, 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 - Sleep disruption due to nocturia contributes to tiredness    Diet: well balanced diet Exercise: physical job Sleep: 930 pm -4am Last dental exam:2 years ago Last eye exam: this year  Depression: phq 9 is negative    03/06/2024    9:42 AM 06/28/2023   10:24 AM 03/01/2023    9:29 AM 03/01/2023    9:24 AM 02/26/2022   10:56 AM  Depression screen PHQ 2/9  Decreased Interest 0 0 0 0 0  Down, Depressed, Hopeless 0 0 0 0 0  PHQ - 2 Score 0 0 0 0 0  Altered sleeping 0      Tired, decreased energy 0      Change in appetite 0      Feeling bad or failure about yourself  0      Trouble concentrating 0      Moving  slowly or fidgety/restless 0      Suicidal thoughts 0      PHQ-9 Score 0      Difficult doing work/chores Not difficult at all        Hypertension:  BP Readings from Last 3 Encounters:  03/06/24 132/78  06/28/23 (!) 132/90  03/01/23 118/82    Obesity: Wt Readings from Last 3 Encounters:  03/06/24 270 lb 1.6 oz (122.5 kg)  06/28/23 272 lb 9.6 oz (123.7 kg)  03/01/23 260 lb (117.9 kg)   BMI Readings from Last 3 Encounters:  03/06/24 39.89 kg/m  06/28/23 40.26 kg/m  03/01/23 38.40 kg/m    Flowsheet Row Office Visit from 03/06/2024 in [facility]  1 49 inches    Lipids:  Lab Results  Component Value Date   CHOL 192 03/01/2023   CHOL 176 02/26/2022   CHOL 179 08/08/2015   Lab Results  Component Value Date   HDL 44 03/01/2023   HDL 42 02/26/2022   HDL 44 08/08/2015   Lab Results  Component Value Date   LDLCALC 120 (H) 03/01/2023   LDLCALC 94 02/26/2022   LDLCALC 106 (H) 08/08/2015   Lab Results  Component Value Date   TRIG 168 (H) 03/01/2023   TRIG 282 (H) 02/26/2022   TRIG 144 08/08/2015  Lab Results  Component Value Date   CHOLHDL 4.4 03/01/2023   CHOLHDL 4.2 02/26/2022   No results found for: LDLDIRECT Glucose:  Glucose, Bld  Date Value Ref Range Status  07/01/2023 89 65 - 99 mg/dL Final    Comment:    .            Fasting reference interval .   03/01/2023 82 65 - 99 mg/dL Final    Comment:    .            Fasting reference interval .   02/26/2022 86 65 - 99 mg/dL Final    Comment:    .            Fasting reference interval .     Flowsheet Row Office Visit from 03/01/2023 in [facility]  AUDIT-C Score 0     Married STD testing and prevention (HIV/chl/gon/syphilis): completed Hep C: completed  Skin cancer: Discussed monitoring for atypical lesions Colorectal cancer: does not qualify Prostate cancer: none No results found for: PSA   Lung cancer:   Low Dose CT Chest  recommended if Age 53-80 years, 30 pack-year currently smoking OR have quit w/in 15years. Patient does not qualify.   AAA:  The USPSTF recommends one-time screening with ultrasonography in men ages 66 to 80 years who have ever smoked ECG:  06/28/2023  Vaccines:  HPV: up to at age 67 , ask insurance if age between 77-45  Shingrix: 33-64 yo and ask insurance if covered when patient above 69 yo Pneumonia:  educated and discussed with patient. Flu:  educated and discussed with patient.  Advanced Care Planning: A voluntary discussion about advance care planning including the explanation and discussion of advance directives.  Discussed health care proxy and Living will, and the patient was able to identify a health care proxy as [name].  Patient does not have a living will at present time. If patient does have living will, I have requested they bring this to the clinic to be scanned in to their chart.  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   Morbid obesity (HCC) 06/04/2015   Smokeless tobacco use 06/04/2015    Past Surgical History:  Procedure Laterality Date   EXTRACORPOREAL SHOCK WAVE LITHOTRIPSY Left 01/29/2019   Procedure: EXTRACORPOREAL SHOCK WAVE LITHOTRIPSY (ESWL);  Surgeon: Penne Knee, MD;  Location: [facility] ORS;  Service: Urology;  Laterality: Left;   thumb cyst      Family History  Problem Relation Age of Onset   Cancer Mother        breast   Diabetes Maternal Grandmother    COPD Neg Hx    Heart disease Neg Hx    Hypertension Neg Hx    Stroke Neg Hx    Bladder Cancer Neg Hx    Kidney disease Neg Hx    Prostate cancer Neg Hx    Sickle cell anemia Neg Hx     Social History   Socioeconomic History   Marital status: Married    Spouse name: Not on file   Number of children: 2   Years of education: Not on file   Highest education level: Not on file  Occupational History    Comment: [employer]  Tobacco Use   Smoking status:  Former    Current packs/day: 0.00    Types: Cigarettes    Start date: 06/02/1996    Quit date: 06/03/1999    Years since  quitting: 24.7   Smokeless tobacco: Current    Types: Chew  Vaping Use   Vaping status: Never Used  Substance and Sexual Activity   Alcohol use: Not Currently    Comment: occasional   Drug use: No   Sexual activity: Yes  Other Topics Concern   Not on file  Social History Narrative   Not on file   Social Drivers of Health   Financial Resource Strain: Low Risk  (03/01/2023)   Overall Financial Resource Strain (CARDIA)    Difficulty of Paying Living Expenses: Not hard at all  Food Insecurity: No Food Insecurity (03/01/2023)   Hunger Vital Sign    Worried About Running Out of Food in the Last Year: Never true    Ran Out of Food in the Last Year: Never true  Transportation Needs: No Transportation Needs (03/01/2023)   PRAPARE - Administrator, Civil Service (Medical): No    Lack of Transportation (Non-Medical): No  Physical Activity: Inactive (03/01/2023)   Exercise Vital Sign    Days of Exercise per Week: 0 days    Minutes of Exercise per Session: 0 min  Stress: No Stress Concern Present (03/01/2023)   [employer] of Occupational Health - Occupational Stress Questionnaire    Feeling of Stress : Only a little  Social Connections: Moderately Integrated (03/01/2023)   Social Connection and Isolation Panel    Frequency of Communication with Friends and Family: More than three times a week    Frequency of Social Gatherings with Friends and Family: More than three times a week    Attends Religious Services: 1 to 4 times per year    Active Member of [employer] or Organizations: No    Attends Banker Meetings: Never    Marital Status: Married  Catering manager Violence: Not At Risk (03/01/2023)   Humiliation, Afraid, Rape, and Kick questionnaire    Fear of Current or Ex-Partner: No    Emotionally Abused: No    Physically Abused: No     Sexually Abused: No     Current Outpatient Medications:    albuterol  (VENTOLIN  HFA) 108 (90 Base) MCG/ACT inhaler, INHALE 1 PUFF INTO THE LUNGS EVERY 4 HOURS AS NEEDED FOR WHEEZING OR SHORTNESS OF BREATH, Disp: 8.5 g, Rfl: 2   augmented betamethasone dipropionate (DIPROLENE-AF) 0.05 % ointment, Apply topically daily., Disp: , Rfl:    clobetasol  cream (TEMOVATE ) 0.05 %, Apply 1 application topically 2 (two) times daily., Disp: 30 g, Rfl: 2   tadalafil  (CIALIS ) 10 MG tablet, Take 1 tablet (10 mg total) by mouth every other day as needed for erectile dysfunction., Disp: 10 tablet, Rfl: 1  No Known Allergies   ROS  Constitutional: Negative for fever or weight change.  Respiratory: Negative for cough and shortness of breath.   Cardiovascular: Negative for chest pain or palpitations.  Gastrointestinal: Negative for abdominal pain, no bowel changes.  Musculoskeletal: Negative for gait problem or joint swelling.  Skin: Negative for rash.  Neurological: Negative for dizziness or headache.  No other specific complaints in a complete review of systems (except as listed in HPI above).    Objective  Vitals:   03/06/24 0942  BP: 132/78  Pulse: 90  Resp: 18  Temp: 98.4 F (36.9 C)  SpO2: 96%  Weight: 270 lb 1.6 oz (122.5 kg)  Height: 5' 9 (1.753 m)    Body mass index is 39.89 kg/m.  Physical Exam Vitals reviewed.  Constitutional:      Appearance:  Normal appearance.  HENT:     Head: Normocephalic.     Right Ear: Tympanic membrane normal.     Left Ear: Tympanic membrane normal.     Nose: Nose normal.  Eyes:     Extraocular Movements: Extraocular movements intact.     Conjunctiva/sclera: Conjunctivae normal.     Pupils: Pupils are equal, round, and reactive to light.  Neck:     Thyroid: No thyroid mass, thyromegaly or thyroid tenderness.  Cardiovascular:     Rate and Rhythm: Normal rate and regular rhythm.     Pulses: Normal pulses.     Heart sounds: Normal heart sounds.   Pulmonary:     Effort: Pulmonary effort is normal.     Breath sounds: Normal breath sounds.  Abdominal:     General: Bowel sounds are normal.     Palpations: Abdomen is soft.  Musculoskeletal:        General: Normal range of motion.     Cervical back: Normal range of motion and neck supple.     Right lower leg: No edema.     Left lower leg: No edema.  Skin:    General: Skin is warm and dry.     Capillary Refill: Capillary refill takes less than 2 seconds.  Neurological:     General: No focal deficit present.     Mental Status: He is alert and oriented to person, place, and time. Mental status is at baseline.  Psychiatric:        Mood and Affect: Mood normal.        Behavior: Behavior normal.        Thought Content: Thought content normal.        Judgment: Judgment normal.      No results found for this or any previous visit (from the past 2160 hours).   Fall Risk:    03/06/2024    9:42 AM 06/28/2023    9:31 AM 03/01/2023    9:24 AM 02/26/2022   10:55 AM 05/16/2018   11:28 AM  Fall Risk   Falls in the past year? 0 0 0 0 0   Number falls in past yr: 0 0 0 0   Injury with Fall? 0 0 0 0   Risk for fall due to :   No Fall Risks    Follow up Falls evaluation completed Falls evaluation completed Falls prevention discussed Falls evaluation completed       Data saved with a previous flowsheet row definition      Functional Status Survey: Is the patient deaf or have difficulty hearing?: No Does the patient have difficulty seeing, even when wearing glasses/contacts?: Yes Does the patient have difficulty concentrating, remembering, or making decisions?: No Does the patient have difficulty walking or climbing stairs?: No Does the patient have difficulty dressing or bathing?: No Does the patient have difficulty doing errands alone such as visiting a doctor's office or shopping?: No    Assessment & Plan  Problem List Items Addressed This Visit   None Visit Diagnoses        Annual physical exam    -  Primary   Relevant Orders   CBC with Differential/Platelet   Comprehensive metabolic panel with GFR   Lipid panel   Hemoglobin A1c     Screening for deficiency anemia       Relevant Orders   CBC with Differential/Platelet     Screening for cholesterol level       Relevant Orders  Lipid panel     Screening for diabetes mellitus       Relevant Orders   Comprehensive metabolic panel with GFR   Hemoglobin A1c     Immunization due       Relevant Orders   Flu vaccine trivalent PF, 6mos and older(Flulaval,Afluria,Fluarix,Fluzone) (Completed)     Screening for prostate cancer       Relevant Orders   PSA      Assessment and Plan Assessment & Plan Adult Wellness Visit Routine adult wellness visit. Blood pressure is 132/78 mmHg. Weight is 270 lbs with a BMI of 39.89. Reports a well-balanced diet and physical activity through work-related tasks. Sleep is interrupted by nocturia, waking up four times a night. Last dental exam was two years ago. Recent eye exam showed no vision issues, but advised to use Biotru wipes for eyelid hygiene due to crusting in the summer. - Perform PSA test as part of physical exam - Administer flu shot  Obesity BMI is 39.89, indicating obesity. Engages in physical activity through work, including lifting and walking.  Nocturia Wakes up four times a night to urinate. Previously evaluated by a urologist and diagnosed with overactive bladder. Medication was prescribed but discontinued due to grogginess. - Perform PSA test to evaluate prostate health  History of nephrolithiasis Three or four episodes of kidney stones in the past, one required lithotripsy.  General Health Maintenance Received flu vaccination today. Discussed healthcare proxy, confirmed [name] as the proxy. No recent dental exam in the past two years. Recent eye exam was normal.     -Prostate cancer screening and PSA options (with potential risks and benefits of  testing vs not testing) were discussed along with recent recs/guidelines. -USPSTF grade A and B recommendations reviewed with patient; age-appropriate recommendations, preventive care, screening tests, etc discussed and encouraged; healthy living encouraged; see AVS for patient education given to patient -Discussed importance of 150 minutes of physical activity weekly, eat two servings of fish weekly, eat one serving of tree nuts ( cashews, pistachios, pecans, almonds.SABRA) every other day, eat 6 servings of fruit/vegetables daily and drink plenty of water and avoid sweet beverages.  -Reviewed Health Maintenance: yes

## 2024-03-07 LAB — COMPREHENSIVE METABOLIC PANEL WITH GFR
AG Ratio: 1.9 (calc) (ref 1.0–2.5)
ALT: 27 U/L (ref 9–46)
AST: 18 U/L (ref 10–40)
Albumin: 4.2 g/dL (ref 3.6–5.1)
Alkaline phosphatase (APISO): 41 U/L (ref 36–130)
BUN: 16 mg/dL (ref 7–25)
CO2: 27 mmol/L (ref 20–32)
Calcium: 8.8 mg/dL (ref 8.6–10.3)
Chloride: 102 mmol/L (ref 98–110)
Creat: 1.02 mg/dL (ref 0.60–1.29)
Globulin: 2.2 g/dL (ref 1.9–3.7)
Glucose, Bld: 82 mg/dL (ref 65–99)
Potassium: 4.4 mmol/L (ref 3.5–5.3)
Sodium: 136 mmol/L (ref 135–146)
Total Bilirubin: 0.4 mg/dL (ref 0.2–1.2)
Total Protein: 6.4 g/dL (ref 6.1–8.1)
eGFR: 93 mL/min/1.73m2 (ref >=60)

## 2024-03-07 LAB — LIPID PANEL
Cholesterol: 168 mg/dL (ref <200)
HDL: 45 mg/dL (ref >=40)
LDL Cholesterol (Calc): 100 mg/dL — ABNORMAL HIGH
Non-HDL Cholesterol (Calc): 123 mg/dL (ref <130)
Total CHOL/HDL Ratio: 3.7 (calc) (ref <5.0)
Triglycerides: 125 mg/dL (ref <150)

## 2024-03-07 LAB — CBC WITH DIFFERENTIAL/PLATELET
Absolute Lymphocytes: 1222 {cells}/uL (ref 850–3900)
Absolute Monocytes: 510 {cells}/uL (ref 200–950)
Basophils Absolute: 31 {cells}/uL (ref 0–200)
Basophils Relative: 0.6 %
Eosinophils Absolute: 78 {cells}/uL (ref 15–500)
Eosinophils Relative: 1.5 %
HCT: 43.3 % (ref 38.5–50.0)
Hemoglobin: 14.2 g/dL (ref 13.2–17.1)
MCH: 29.1 pg (ref 27.0–33.0)
MCHC: 32.8 g/dL (ref 32.0–36.0)
MCV: 88.7 fL (ref 80.0–100.0)
MPV: 11.2 fL (ref 7.5–12.5)
Monocytes Relative: 9.8 %
Neutro Abs: 3359 {cells}/uL (ref 1500–7800)
Neutrophils Relative %: 64.6 %
Platelets: 161 Thousand/uL (ref 140–400)
RBC: 4.88 Million/uL (ref 4.20–5.80)
RDW: 12.8 % (ref 11.0–15.0)
Total Lymphocyte: 23.5 %
WBC: 5.2 Thousand/uL (ref 3.8–10.8)

## 2024-03-07 LAB — HEMOGLOBIN A1C
Hgb A1c MFr Bld: 5.7 % — ABNORMAL HIGH (ref <5.7)
Mean Plasma Glucose: 117 mg/dL
eAG (mmol/L): 6.5 mmol/L

## 2024-03-07 LAB — PSA: PSA: 0.3 ng/mL (ref <=4.00)

## 2024-03-09 ENCOUNTER — Ambulatory Visit: Payer: Self-pay | Admitting: Nurse Practitioner

## 2024-04-06 ENCOUNTER — Other Ambulatory Visit: Payer: Self-pay

## 2024-04-06 DIAGNOSIS — J4520 Mild intermittent asthma, uncomplicated: Secondary | ICD-10-CM

## 2024-04-06 MED ORDER — ALBUTEROL SULFATE HFA 108 (90 BASE) MCG/ACT IN AERS: 1.0000 | INHALATION_SPRAY | RESPIRATORY_TRACT | 2 refills | Status: AC | PRN

## 2024-04-15 ENCOUNTER — Ambulatory Visit: Payer: Self-pay | Admitting: Podiatry

## 2024-05-25 ENCOUNTER — Ambulatory Visit
Admission: RE | Admit: 2024-05-25 | Discharge: 2024-05-25 | Disposition: A | Source: Ambulatory Visit | Attending: Family Medicine | Admitting: Family Medicine

## 2024-05-25 ENCOUNTER — Ambulatory Visit: Payer: Self-pay | Admitting: Family Medicine

## 2024-05-25 ENCOUNTER — Ambulatory Visit: Admitting: Family Medicine

## 2024-05-25 ENCOUNTER — Ambulatory Visit
Admission: RE | Admit: 2024-05-25 | Discharge: 2024-05-25 | Disposition: A | Discharge status: Home / Self Care | Attending: Family Medicine | Admitting: Family Medicine

## 2024-05-25 ENCOUNTER — Encounter: Payer: Self-pay | Admitting: Family Medicine

## 2024-05-25 VITALS — BP 120/72 | HR 91 | Resp 16 | Ht 69.0 in | Wt 271.0 lb

## 2024-05-25 DIAGNOSIS — R051 Acute cough: Secondary | ICD-10-CM | POA: Insufficient documentation

## 2024-05-25 DIAGNOSIS — J4520 Mild intermittent asthma, uncomplicated: Secondary | ICD-10-CM | POA: Diagnosis not present

## 2024-05-25 MED ORDER — ALBUTEROL SULFATE (2.5 MG/3ML) 0.083% IN NEBU: 2.5000 mg | INHALATION_SOLUTION | Freq: Four times a day (QID) | RESPIRATORY_TRACT | 0 refills | Status: AC | PRN

## 2024-05-25 MED ORDER — ALBUTEROL SULFATE (2.5 MG/3ML) 0.083% IN NEBU
2.5000 mg | INHALATION_SOLUTION | Freq: Once | RESPIRATORY_TRACT | Status: AC
  Administered 2024-05-25: 2.5 mg via RESPIRATORY_TRACT

## 2024-05-25 NOTE — Progress Notes (Signed)
 "  Acute Office Visit  Subjective:     Patient ID: Jesus Nelson, male    DOB: 07/22/1980, 43 y.o.   MRN: [id number]  Chief Complaint  Patient presents with   Cough    X3 weeks, non-productive. OTC meds not helping.    HPI Patient is in today for complaints of cough for the last two weeks, going on three weeks. Jesus Nelson is a new patient to myself, however well established in office. Jesus Nelson voices initially symptoms included sinus congestion and sinus pressure but this resolved with OTC Mucinex and nasal lavage. Jesus Nelson states sometimes when Jesus Nelson breathes Jesus Nelson feels a tickle sensation as if there is something to cough up but cough is dry. Shortness of breath only with coughing episodes, denies shortness of breath with exertion or at rest. Jesus Nelson voices Jesus Nelson has been requiring use of Albuterol  inhaler, last used this morning, due to symptoms. Jesus Nelson typically only uses this with seasonal allergies, predominantly Spring and Fall. Prior documented asthma diagnosis.   Jesus Nelson is worried because Jesus Nelson has surgery tomorrow morning.  Review of Systems  Constitutional:  Negative for fever.  HENT:  Negative for congestion and sinus pain.   Respiratory:  Positive for cough and shortness of breath.   Cardiovascular:  Negative for chest pain.        Objective:    BP 120/72   Pulse 91   Resp 16   Ht 5' 9 (1.753 m)   Wt 271 lb (122.9 kg)   SpO2 99%   BMI 40.02 kg/m    Physical Exam Constitutional:      Appearance: Normal appearance.  Cardiovascular:     Rate and Rhythm: Normal rate and regular rhythm.  Pulmonary:     Effort: Pulmonary effort is normal. No respiratory distress.     Breath sounds: Wheezing present. No rhonchi or rales.  Skin:    General: Skin is warm and dry.  Neurological:     General: No focal deficit present.     Mental Status: Jesus Nelson is alert.  Psychiatric:        Mood and Affect: Mood normal.        Behavior: Behavior normal.         Assessment & Plan:   Assessment & Plan Acute  cough Patient is a 43 year old male seen in office today due to complaints of non-productive cough for the last two to three weeks. Jesus Nelson endorses shortness of breath with increased coughing episodes, denies shortness of breath at rest or with exertion. Jesus Nelson has been using his Albuterol  inhaler frequently over the last couple of weeks.   V/s stable. PE pertinent for wheezing of the right upper lobe. All lung fields clear to auscultation after neb treatment.   -STAT chest x-ray ordered for further evaluation/ rule out pneumonia. Jesus Nelson is agreeable. -Albuterol  neb solution given in office. -Prescription for Benzonatate 100mg  PO BID PRN cough. Jesus Nelson is receptive.  Orders:   DG Chest 2 View; Future   albuterol  (PROVENTIL ) (2.5 MG/3ML) 0.083% nebulizer solution 2.5 mg   benzonatate (TESSALON) 100 MG capsule; Take 1 capsule (100 mg total) by mouth 2 (two) times daily as needed for cough.   albuterol  (PROVENTIL ) (2.5 MG/3ML) 0.083% nebulizer solution; Take 3 mLs (2.5 mg total) by nebulization every 6 (six) hours as needed for wheezing or shortness of breath.  Mild intermittent asthma without complication Documented asthma diagnosis and per patient report symptoms typically flare in the Spring and Fall s/t uncontrolled allergic rhinitis.  Cough x 2 to 3 weeks that is non-productive. Surgery scheduled tomorrow and advised patient that with acute illness surgery could potentially be cancelled.   -Chest x-ray ordered to rule out pneumonia -Neb solution given in office -Start Advair 1 puff BID while symptoms persist -Return precautions advised Orders:   albuterol  (PROVENTIL ) (2.5 MG/3ML) 0.083% nebulizer solution; Take 3 mLs (2.5 mg total) by nebulization every 6 (six) hours as needed for wheezing or shortness of breath.   fluticasone-salmeterol (ADVAIR) 100-50 MCG/ACT AEPB; Inhale 1 puff into the lungs 2 (two) times daily.      Meds ordered this encounter  Medications   albuterol  (PROVENTIL ) (2.5 MG/3ML)  0.083% nebulizer solution 2.5 mg   benzonatate (TESSALON) 100 MG capsule    Sig: Take 1 capsule (100 mg total) by mouth 2 (two) times daily as needed for cough.    Dispense:  20 capsule    Refill:  0   albuterol  (PROVENTIL ) (2.5 MG/3ML) 0.083% nebulizer solution    Sig: Take 3 mLs (2.5 mg total) by nebulization every 6 (six) hours as needed for wheezing or shortness of breath.    Dispense:  150 mL    Refill:  0   fluticasone-salmeterol (ADVAIR) 100-50 MCG/ACT AEPB    Sig: Inhale 1 puff into the lungs 2 (two) times daily.    Dispense:  1 each    Refill:  0    Return if symptoms worsen or fail to improve.  Jesus LOISE CORE, FNP   "

## 2025-03-12 ENCOUNTER — Encounter: Admitting: Nurse Practitioner
